# Patient Record
Sex: Female | Born: 1965 | Race: White | Hispanic: No | Smoking: Never smoker
Health system: Southern US, Community
[De-identification: ages and names within clinical notes are randomized; demographics above are authoritative.]

## PROBLEM LIST (undated history)

## (undated) HISTORY — PX: BREAST LUMPECTOMY: SHX2

---

## 1998-06-23 ENCOUNTER — Ambulatory Visit (HOSPITAL_COMMUNITY): Admission: RE | Admit: 1998-06-23 | Discharge: 1998-06-23 | Payer: Self-pay | Admitting: Obstetrics and Gynecology

## 1999-06-23 ENCOUNTER — Ambulatory Visit (HOSPITAL_COMMUNITY): Admission: RE | Admit: 1999-06-23 | Discharge: 1999-06-23 | Payer: Self-pay | Admitting: Obstetrics and Gynecology

## 1999-06-23 ENCOUNTER — Encounter: Payer: Self-pay | Admitting: Obstetrics and Gynecology

## 1999-07-16 ENCOUNTER — Emergency Department (HOSPITAL_COMMUNITY): Admission: EM | Admit: 1999-07-16 | Discharge: 1999-07-16 | Payer: Self-pay | Admitting: Emergency Medicine

## 1999-08-28 ENCOUNTER — Ambulatory Visit (HOSPITAL_COMMUNITY): Admission: RE | Admit: 1999-08-28 | Discharge: 1999-08-28 | Payer: Self-pay | Admitting: Obstetrics and Gynecology

## 1999-11-15 ENCOUNTER — Inpatient Hospital Stay (HOSPITAL_COMMUNITY): Admission: AD | Admit: 1999-11-15 | Discharge: 1999-11-17 | Payer: Self-pay | Admitting: Obstetrics and Gynecology

## 1999-12-26 ENCOUNTER — Other Ambulatory Visit: Admission: RE | Admit: 1999-12-26 | Discharge: 1999-12-26 | Payer: Self-pay | Admitting: Obstetrics and Gynecology

## 2001-01-08 ENCOUNTER — Other Ambulatory Visit: Admission: RE | Admit: 2001-01-08 | Discharge: 2001-01-08 | Payer: Self-pay | Admitting: Obstetrics and Gynecology

## 2002-09-04 ENCOUNTER — Encounter: Admission: RE | Admit: 2002-09-04 | Discharge: 2002-09-04 | Payer: Self-pay | Admitting: *Deleted

## 2002-09-04 ENCOUNTER — Encounter: Payer: Self-pay | Admitting: *Deleted

## 2002-09-14 ENCOUNTER — Ambulatory Visit (HOSPITAL_COMMUNITY): Admission: RE | Admit: 2002-09-14 | Discharge: 2002-09-14 | Payer: Self-pay | Admitting: *Deleted

## 2002-10-07 ENCOUNTER — Observation Stay (HOSPITAL_COMMUNITY): Admission: RE | Admit: 2002-10-07 | Discharge: 2002-10-08 | Payer: Self-pay | Admitting: Surgery

## 2002-10-07 ENCOUNTER — Encounter: Payer: Self-pay | Admitting: Surgery

## 2004-06-18 HISTORY — PX: CHOLECYSTECTOMY: SHX55

## 2004-08-24 ENCOUNTER — Ambulatory Visit: Payer: Self-pay | Admitting: Family Medicine

## 2007-10-15 ENCOUNTER — Ambulatory Visit: Payer: Self-pay | Admitting: Family Medicine

## 2009-07-26 ENCOUNTER — Ambulatory Visit: Payer: Self-pay

## 2009-11-21 ENCOUNTER — Ambulatory Visit: Payer: Self-pay | Admitting: Family Medicine

## 2010-05-03 ENCOUNTER — Observation Stay: Payer: Self-pay | Admitting: Internal Medicine

## 2011-08-30 ENCOUNTER — Ambulatory Visit: Payer: Self-pay | Admitting: Family Medicine

## 2013-02-11 LAB — CBC AND DIFFERENTIAL
HEMATOCRIT: 43 % (ref 36–46)
HEMOGLOBIN: 13.6 g/dL (ref 12.0–16.0)
NEUTROS ABS: 67 /uL
Platelets: 266 10*3/uL (ref 150–399)
WBC: 7.5 10^3/mL

## 2013-04-14 ENCOUNTER — Ambulatory Visit: Payer: Self-pay | Admitting: Family Medicine

## 2013-04-14 LAB — HM MAMMOGRAPHY

## 2014-11-01 LAB — HEPATIC FUNCTION PANEL
ALT: 22 U/L (ref 7–35)
AST: 21 U/L (ref 13–35)
Alkaline Phosphatase: 94 U/L (ref 25–125)
BILIRUBIN, TOTAL: 0.4 mg/dL

## 2014-11-01 LAB — BASIC METABOLIC PANEL
BUN: 9 mg/dL (ref 4–21)
Creatinine: 0.7 mg/dL (ref 0.5–1.1)
GLUCOSE: 107 mg/dL
Potassium: 3.9 mmol/L (ref 3.4–5.3)
SODIUM: 134 mmol/L — AB (ref 137–147)

## 2014-11-01 LAB — TSH: TSH: 1.47 u[IU]/mL (ref 0.41–5.90)

## 2015-01-03 ENCOUNTER — Other Ambulatory Visit: Payer: Self-pay | Admitting: Family Medicine

## 2015-01-05 NOTE — Telephone Encounter (Signed)
Do you want both of these refilled Sheryl Tran 

## 2015-01-07 ENCOUNTER — Other Ambulatory Visit: Payer: Self-pay

## 2015-01-07 NOTE — Telephone Encounter (Signed)
Fax from [facility] please review-last OV was in May 2016-order is in the chart

## 2015-01-09 MED ORDER — BUTALBITAL-ASA-CAFFEINE 50-325-40 MG PO CAPS: ORAL_CAPSULE | ORAL | Status: DC

## 2015-01-09 MED ORDER — ALPRAZOLAM 0.5 MG PO TBDP: 0.5000 mg | ORAL_TABLET | Freq: Two times a day (BID) | ORAL | Status: DC | PRN

## 2015-01-10 ENCOUNTER — Telehealth: Payer: Self-pay | Admitting: Family Medicine

## 2015-01-10 NOTE — Telephone Encounter (Signed)
I rec'd a voicemail message from [facility] stating they rec'd several duplicate Rx faxed today and they are need to clarify which Rx is correct.  CB#[phone]/MW

## 2015-02-15 ENCOUNTER — Encounter: Payer: Self-pay | Admitting: Podiatry

## 2015-02-15 ENCOUNTER — Ambulatory Visit (INDEPENDENT_AMBULATORY_CARE_PROVIDER_SITE_OTHER): Payer: BLUE CROSS/BLUE SHIELD | Admitting: Podiatry

## 2015-02-15 ENCOUNTER — Ambulatory Visit (INDEPENDENT_AMBULATORY_CARE_PROVIDER_SITE_OTHER): Payer: BLUE CROSS/BLUE SHIELD

## 2015-02-15 VITALS — BP 119/82 | HR 91 | Resp 17

## 2015-02-15 DIAGNOSIS — M779 Enthesopathy, unspecified: Secondary | ICD-10-CM

## 2015-02-15 DIAGNOSIS — M79671 Pain in right foot: Secondary | ICD-10-CM

## 2015-02-15 MED ORDER — MELOXICAM 7.5 MG PO TABS: 7.5000 mg | ORAL_TABLET | Freq: Every day | ORAL | Status: DC

## 2015-02-15 NOTE — Progress Notes (Signed)
Patient ID: Sheryl Tran, female   DOB: [birth year], 49 y.o.   MRN: [id number]  Subjective: 49 year old female presents the office today for concerns her right foot pain. She states that before weeks ago she got out of bed her foot was numb resulting in her twisting her foot. She states that she was having pain in the outside portion of her foot which continues without much relief. She was trying ice the area which helps and she also injured a walk-in clinic. She states that flip-flops seem to help in the tennis shoe seems to hurt the area. She denies any numbness or tingling in his time. She says the area swells intermittently denies any redness to the area. She states that the spinous mostly outside portion of her foot. She states that she has pain with prolonged weightbearing or ambulation. No other complaints at this time in no acute changes.  Objective: AAO 3, NAD DP/PT pulses palpable, CRT less than 3 seconds Protective sensation intact with Simms Weinstein monofilament, Achilles tendon reflex intact. There is tenderness palpation along the course of the peroneal tendons just inferior to the lateral malleolus and just proximal to the fifth metatarsal base. There is pain with eversion of the foot. Peroneal tendons appear to be intact. There is no other areas tenderness of pinpoint bony tenderness to bilateral lower shoes. There is mild edema along the lateral aspect of the foot. There is no pain along the course the lateral ankle ligaments. There is no tenderness of the ankle joint and and ankle, subtalar joint range of motion is intact.There is no associated erythema or increase in warmth. No open lesions or pre-ulcerative lesions. There is no pain with calf compression, swelling, warmth, erythema. MMT 5/5, ROM WNL  Assessment: 49 year old female with peroneal tendinitis  Plan: -Treatment options discussed including all alternatives, risks, and complications -X-rays were obtained and reviewed  with the patient.  -discussed likely etiology of her symptoms. -I discussed a steroid injection due to the prolonged nature of the swelling and the pain. I discussed the risks and complications including tendon rupture for which she understands and verbally consents. Under sterile conditions a total of 1 mL mixture of dexamethasone phosphate and 2% lidocaine plain was infiltrated into the area of maximal tenderness around the pain nail tendon. Care was taken not to infiltrate directly into the tendon. She tolerated the injection well any complications. Post injection care was discussed. -Ankle brace was dispensed. -Ice and elevation. -Prescribed mobic. Discussed side effects of the medication and directed to stop if any are to occur and call the office.  -Follow-up 2-3 weeks or sooner if any problems arise. In the meantime, encouraged to call the office with any questions, concerns, change in symptoms. Likely give range of motion/rehabilitation exercises for peroneal tendons appointment.  Ovid Curd, DPM

## 2015-02-23 DIAGNOSIS — I10 Essential (primary) hypertension: Secondary | ICD-10-CM | POA: Insufficient documentation

## 2015-02-23 DIAGNOSIS — G43909 Migraine, unspecified, not intractable, without status migrainosus: Secondary | ICD-10-CM | POA: Insufficient documentation

## 2015-02-23 DIAGNOSIS — G4700 Insomnia, unspecified: Secondary | ICD-10-CM | POA: Insufficient documentation

## 2015-02-23 DIAGNOSIS — N943 Premenstrual tension syndrome: Secondary | ICD-10-CM | POA: Insufficient documentation

## 2015-02-23 DIAGNOSIS — F329 Major depressive disorder, single episode, unspecified: Secondary | ICD-10-CM | POA: Insufficient documentation

## 2015-02-23 DIAGNOSIS — E669 Obesity, unspecified: Secondary | ICD-10-CM | POA: Insufficient documentation

## 2015-02-23 DIAGNOSIS — E039 Hypothyroidism, unspecified: Secondary | ICD-10-CM | POA: Insufficient documentation

## 2015-02-23 DIAGNOSIS — F32A Depression, unspecified: Secondary | ICD-10-CM | POA: Insufficient documentation

## 2015-02-23 DIAGNOSIS — F419 Anxiety disorder, unspecified: Secondary | ICD-10-CM | POA: Insufficient documentation

## 2015-02-23 DIAGNOSIS — M792 Neuralgia and neuritis, unspecified: Secondary | ICD-10-CM | POA: Insufficient documentation

## 2015-02-24 ENCOUNTER — Encounter: Payer: Self-pay | Admitting: Family Medicine

## 2015-02-24 ENCOUNTER — Ambulatory Visit (INDEPENDENT_AMBULATORY_CARE_PROVIDER_SITE_OTHER): Payer: BLUE CROSS/BLUE SHIELD | Admitting: Family Medicine

## 2015-02-24 ENCOUNTER — Ambulatory Visit: Payer: Self-pay | Admitting: Family Medicine

## 2015-02-24 VITALS — BP 118/78 | HR 60 | Temp 98.7°F | Resp 16 | Wt 173.0 lb

## 2015-02-24 DIAGNOSIS — E039 Hypothyroidism, unspecified: Secondary | ICD-10-CM | POA: Diagnosis not present

## 2015-02-24 DIAGNOSIS — N943 Premenstrual tension syndrome: Secondary | ICD-10-CM

## 2015-02-24 DIAGNOSIS — G4700 Insomnia, unspecified: Secondary | ICD-10-CM

## 2015-02-24 DIAGNOSIS — F419 Anxiety disorder, unspecified: Secondary | ICD-10-CM

## 2015-02-24 DIAGNOSIS — E669 Obesity, unspecified: Secondary | ICD-10-CM | POA: Diagnosis not present

## 2015-02-24 MED ORDER — PHENTERMINE HCL 37.5 MG PO CAPS: 37.5000 mg | ORAL_CAPSULE | ORAL | Status: DC

## 2015-02-24 NOTE — Progress Notes (Signed)
Patient ID: Sheryl Tran, female   DOB: 1965-09-01, 49 y.o.   MRN: [id number]       Patient: Sheryl Tran Female    DOB: 06/10/1966   49 y.o.   MRN: [id number] Visit Date: 02/24/2015  Today's Provider: [name], MD   Chief Complaint  Patient presents with  . Hypothyroidism    follow-up, last ov was on 11/01/2014  . Anxiety    follow-up  . Obesity    follow-up  . Insomnia    follow-up, started on trazodone  . right ankle pain    started started 7 weeks ago. have a brace on which is helping" pt stated   Subjective:    Anxiety Presents for follow-up visit. The problem has been resolved. Symptoms include insomnia. Patient reports no suicidal ideas.    Insomnia Primary symptoms: no fragmented sleep, no sleep disturbance, no somnolence, no frequent awakening, no premature morning awakening, no malaise/fatigue.  The current episode started more than one month. The problem occurs intermittently. Past treatments include meditation (trazodone).       Allergies  Allergen Reactions  . Amoxicillin Rash   Previous Medications   ALPRAZOLAM (NIRAVAM) 0.5 MG DISSOLVABLE TABLET    Take 1 tablet (0.5 mg total) by mouth 2 (two) times daily as needed for anxiety.   ALPRAZOLAM (XANAX) 0.5 MG TABLET    TAKE 1 TABLET BY MOUTH TWICE A DAY AS NEEDED   AMLODIPINE (NORVASC) 5 MG TABLET    Take 1 tablet by mouth daily.   BUPROPION (WELLBUTRIN XL) 150 MG 24 HR TABLET    Take 1 tablet by mouth daily.   BUTALBITAL-ASPIRIN-CAFFEINE (FIORINAL) 50-325-40 MG PER CAPSULE    TAKE 1 TO 2 CAPSULES BY MOUTH EVERY 8 HOURS AS NEEDED   CALCIUM CARBONATE-VITAMIN D 600-200 MG-UNIT CAPS    Take 1 tablet by mouth 2 (two) times daily.   FLUTICASONE (FLONASE) 50 MCG/ACT NASAL SPRAY    Place 2 sprays into the nose daily.   FUROSEMIDE (LASIX) 20 MG TABLET    1 tablet daily as needed.   HYDROCODONE-ACETAMINOPHEN (NORCO/VICODIN) 5-325 MG PER TABLET    Take 1 tablet by mouth every 6 (six) hours as needed.   LEVOTHYROXINE (SYNTHROID, LEVOTHROID) 137 MCG TABLET    Take 1 tablet by mouth daily.   MELOXICAM (MOBIC) 7.5 MG TABLET    Take 1 tablet (7.5 mg total) by mouth daily.   MULTIPLE VITAMINS-MINERALS PO    Take 1 tablet by mouth daily.   TRAZODONE (DESYREL) 50 MG TABLET    Take 1 tablet by mouth at bedtime. 1 hour prior to bed time    Review of Systems  Constitutional: Positive for fatigue. Negative for malaise/fatigue.  HENT: Positive for congestion.        Feel not having a lot of energy. Feels like a dry congestion" pt stated also reports sinus headaches.  Respiratory: Negative.   Cardiovascular: Negative.   Gastrointestinal: Negative.   Musculoskeletal:       C/o right ankle pain, when getting out of bed it rolled, have a brace on which is helping, pt reports not having pain at this moment.  Allergic/Immunologic: Negative.   Neurological: Negative.   Hematological: Negative.   Psychiatric/Behavioral: Negative for suicidal ideas and sleep disturbance. The patient has insomnia.    pt reports that she also want to talk about her weight.   Social History  Substance Use Topics  . Smoking status: Former Games developer  . Smokeless tobacco: Never  Used  . Alcohol Use: 0.0 oz/week    0 Standard drinks or equivalent per week     Comment: OCCASIONALLY   Objective:   BP 118/78 mmHg  Pulse 60  Temp(Src) 98.7 F (37.1 C) (Oral)  Resp 16  Wt 173 lb (78.472 kg)  LMP 02/17/2015  Physical Exam  Constitutional: She is oriented to person, place, and time. She appears well-developed and well-nourished.  Mildly obese white female.  HENT:  Head: Normocephalic and atraumatic.  Right Ear: External ear normal.  Left Ear: External ear normal.  Nose: Nose normal.  Neck: Neck supple.  Cardiovascular: Normal rate, regular rhythm and normal heart sounds.   Pulmonary/Chest: Effort normal and breath sounds normal.  Abdominal: Soft.  Neurological: She is alert and oriented to person, place, and time.    Skin: Skin is warm and dry.  Psychiatric: She has a normal mood and affect. Her behavior is normal. Judgment and thought content normal.        Assessment & Plan:     1. Hypothyroidism, unspecified hypothyroidism type TSH  2. Acute anxiety Chronic issue.  3. Obesity Short term answer--meds for 3-5 months if effective. - phentermine 37.5 MG capsule; Take 1 capsule (37.5 mg total) by mouth every morning.  Dispense: 30 capsule; Refill: 3  4. Insomnia   5. PMS (premenstrual syndrome)        [name], MD  [facility]

## 2015-03-08 ENCOUNTER — Ambulatory Visit (INDEPENDENT_AMBULATORY_CARE_PROVIDER_SITE_OTHER): Payer: BLUE CROSS/BLUE SHIELD | Admitting: Podiatry

## 2015-03-08 ENCOUNTER — Encounter: Payer: Self-pay | Admitting: Podiatry

## 2015-03-08 VITALS — BP 107/73 | HR 87 | Resp 18

## 2015-03-08 DIAGNOSIS — M79671 Pain in right foot: Secondary | ICD-10-CM

## 2015-03-08 DIAGNOSIS — M779 Enthesopathy, unspecified: Secondary | ICD-10-CM | POA: Diagnosis not present

## 2015-03-08 NOTE — Patient Instructions (Signed)
Peroneal Tendinitis with Rehab Tendonitis is inflammation of a tendon. Inflammation of the tendons on the back of the outer ankle (peroneal tendons) is known as peroneal tendonitis. The peroneal tendons are responsible for connecting the muscles that allow you to stand on your tiptoes to the bones of the ankle. For this reason, peroneal tendonitis often causes pain when trying to complete such motions. Peroneal tendonitis often involves a tear (strain) of the peroneal tendons. Strains are classified into three categories. Grade 1 strains cause pain, but the tendon is not lengthened. Grade 2 strains include a lengthened ligament, due to the ligament being stretched or partially ruptured. With grade 2 strains there is still function, although function may be decreased. Grade 3 strains involve a complete tear of the tendon or muscle, and function is usually impaired. SYMPTOMS   Pain, tenderness, swelling, warmth, or redness over the back of the outer side of the ankle, the outer part of the mid-foot, or the bottom of the arch.  Pain that gets worse with ankle motion (especially when pushing off or pushing down with the front of the foot), or when standing on the ball of the foot or pushing the foot outward.  Crackling sound (crepitation) when the tendon is moved or touched. CAUSES  Peroneal tendinitis occurs when injury to the peroneal tendons causes the body to respond with inflammation. Common causes of injury include:  An overuse injury, in which the groove behind the outer ankle (where the tendon is located) causes wear on the tendon.  A sudden stress placed on the tendon, such as from an increase in the intensity, frequency, or duration of training.  Direct hit (trauma) to the tendon.  Return to activity too soon after a previous ankle injury. RISK INCREASES WITH:  Sports that require sudden, repetitive pushing off of the foot, such as jumping or quick starts.  Kicking and running sports,  especially running down hills or long distances.  Poor strength and flexibility.  Previous injury to the foot, ankle, or leg. PREVENTION  Warm up and stretch properly before activity.  Allow for adequate recovery between workouts.  Maintain physical fitness:  Strength, flexibility, and endurance.  Cardiovascular fitness.  Complete rehabilitation after previous injury. PROGNOSIS  If treated properly, peroneal tendonitis usually heals within 6 weeks.  RELATED COMPLICATIONS  Longer healing time, if not properly treated or if not given enough time to heal.  Recurring symptoms if activity is resumed too soon, with overuse, or when using poor technique.  If untreated, tendinitis may result in tendon rupture, requiring surgery. TREATMENT  Treatment first involves the use of ice and medicine to reduce pain and inflammation. The use of strengthening and stretching exercises may help reduce pain with activity. These exercises may be performed at home or with a therapist. Sometimes, the foot and ankle will be restrained for 10 to 14 days to promote healing. Your caregiver may advise that you place a heel lift in your shoes to reduce the stress placed on the tendon. If nonsurgical treatment is unsuccessful, surgery to remove the inflamed tendon lining (sheath) may be advised.  MEDICATION   If pain medicine is needed, nonsteroidal anti-inflammatory medicines (aspirin and ibuprofen), or other minor pain relievers (acetaminophen), are often advised.  Do not take pain medicine for 7 days before surgery.  Prescription pain relievers may be given, if your caregiver thinks they are needed. Use only as directed and only as much as you need. HEAT AND COLD  Cold treatment (icing) should   be applied for 10 to 15 minutes every 2 to 3 hours for inflammation and pain, and immediately after activity that aggravates your symptoms. Use ice packs or an ice massage.  Heat treatment may be used before  performing stretching and strengthening activities prescribed by your caregiver, physical therapist, or athletic trainer. Use a heat pack or a warm water soak. SEEK MEDICAL CARE IF:  Symptoms get worse or do not improve in 2 to 4 weeks, despite treatment.  New, unexplained symptoms develop. (Drugs used in treatment may produce side effects.) EXERCISES RANGE OF MOTION (ROM) AND STRETCHING EXERCISES - Peroneal Tendinitis These exercises may help you when beginning to rehabilitate your injury. Your symptoms may resolve with or without further involvement from your physician, physical therapist or athletic trainer. While completing these exercises, remember:   Restoring tissue flexibility helps normal motion to return to the joints. This allows healthier, less painful movement and activity.  An effective stretch should be held for at least 30 seconds.  A stretch should never be painful. You should only feel a gentle lengthening or release in the stretched tissue. RANGE OF MOTION - Ankle Eversion  Sit with your right / left ankle crossed over your opposite knee.  Grip your foot with your opposite hand, placing your thumb on the top of your foot and your fingers across the bottom of your foot.  Gently push your foot downward with a slight rotation, so your littlest toes rise slightly toward the ceiling.  You should feel a gentle stretch on the inside of your ankle. Hold the stretch for __________ seconds. Repeat __________ times. Complete this exercise __________ times per day.  RANGE OF MOTION - Ankle Inversion  Sit with your right / left ankle crossed over your opposite knee.  Grip your foot with your opposite hand, placing your thumb on the bottom of your foot and your fingers across the top of your foot.  Gently pull your foot so the smallest toe comes toward you and your thumb pushes the inside of the ball of your foot away from you.  You should feel a gentle stretch on the outside of  your ankle. Hold the stretch for __________ seconds. Repeat __________ times. Complete this exercise __________ times per day.  RANGE OF MOTION - Ankle Plantar Flexion  Sit with your right / left leg crossed over your opposite knee.  Use your opposite hand to pull the top of your foot and toes toward you.  You should feel a gentle stretch on the top of your foot and ankle. Hold this position for __________ seconds. Repeat __________ times. Complete __________ times per day.  STRETCH - Gastroc, Standing  Place your hands on a wall.  Extend your right / left leg behind you, keeping the front knee somewhat bent.  Slightly point your toes inward on your back foot.  Keeping your right / left heel on the floor and your knee straight, shift your weight toward the wall, not allowing your back to arch.  You should feel a gentle stretch in the calf. Hold this position for __________ seconds. Repeat __________ times. Complete this stretch __________ times per day. STRETCH - Soleus, Standing  Place your hands on a wall.  Extend your right / left leg behind you, keeping the other knee somewhat bent.  Slightly point your toes inward on your back foot.  Keep your heel on the floor, bend your back knee, and slightly shift your weight over the back leg so that   you feel a gentle stretch deep in your back calf.  Hold this position for __________ seconds. Repeat __________ times. Complete this stretch __________ times per day. STRETCH - Gastrocsoleus, Standing Note: This exercise can place a lot of stress on your foot and ankle. Please complete this exercise only if specifically instructed by your caregiver.   Place the ball of your right / left foot on a step, keeping your other foot firmly on the same step.  Hold on to the wall or a rail for balance.  Slowly lift your other foot, allowing your body weight to press your heel down over the edge of the step.  You should feel a stretch in your  right / left calf.  Hold this position for __________ seconds.  Repeat this exercise with a slight bend in your knee. Repeat __________ times. Complete this stretch __________ times per day.  STRENGTHENING EXERCISES - Peroneal Tendinitis  These exercises may help you when beginning to rehabilitate your injury. They may resolve your symptoms with or without further involvement from your physician, physical therapist or athletic trainer. While completing these exercises, remember:   Muscles can gain both the endurance and the strength needed for everyday activities through controlled exercises.  Complete these exercises as instructed by your physician, physical therapist or athletic trainer. Increase the resistance and repetitions only as guided by your caregiver. STRENGTH - Dorsiflexors  Secure a rubber exercise band or tubing to a fixed object (table, pole) and loop the other end around your right / left foot.  Sit on the floor facing the fixed object. The band should be slightly tense when your foot is relaxed.  Slowly draw your foot back toward you, using your ankle and toes.  Hold this position for __________ seconds. Slowly release the tension in the band and return your foot to the starting position. Repeat __________ times. Complete this exercise __________ times per day.  STRENGTH - Towel Curls  Sit in a chair, on a non-carpeted surface.  Place your foot on a towel, keeping your heel on the floor.  Pull the towel toward your heel only by curling your toes. Keep your heel on the floor.  If instructed by your physician, physical therapist or athletic trainer, add weight to the end of the towel. Repeat __________ times. Complete this exercise __________ times per day. STRENGTH - Ankle Eversion   Secure one end of a rubber exercise band or tubing to a fixed object (table, pole). Loop the other end around your foot, just before your toes.  Place your fists between your knees.  This will focus your strengthening at your ankle.  Drawing the band across your opposite foot, away from the pole, slowly, pull your little toe out and up. Make sure the band is positioned to resist the entire motion.  Hold this position for __________ seconds.  Have your muscles resist the band, as it slowly pulls your foot back to the starting position. Repeat __________ times. Complete this exercise __________ times per day.  Document Released: 06/04/2005 Document Revised: 10/19/2013 Document Reviewed: 09/16/2008 Exit[employer] Patient Information 2015 [employer]. This information is not intended to replace advice given to you by your health care provider. Make sure you discuss any questions you have with your health care provider.  

## 2015-03-09 NOTE — Progress Notes (Signed)
Patient ID: CAREE Tran, female   DOB: April 04, 1966, 49 y.o.   MRN: [id number]  Subjective: 49 year old female presents the office today for follow-up evaluation of right foot pain, tendinitis. She states that since last appointment she's had considerable improvement in pain. She wears the ankle brace daily which seems to help. She had no complications with the injection. She is continuing ice. She's been continuing with the meloxicam without any side effects. No other complaints at this time in no acute changes.  Objective: AAO 3, NAD Neurovascular status intact and unchanged There is mild tenderness to palpation just proximal to the fifth metatarsal base on the right foot although appears to be greatly improved. There is no pain on the course of the peroneal tendons elsewhere and no defect is noted. No pain with eversion. There is trace edema over the lateral aspect of the foot over the area of tenderness. There is no associated erythema or increase in warmth. No other areas of tenderness to bilateral lower extremities. No open lesions or pre-ulcer lesions. There is no pain with calf compression, swelling, warmth, erythema.  Assessment: 49 year old female with resolving right foot tendinitis  Plan: -Treatment options discussed including all alternatives, risks, and complications -Rehabilitation exercises were discussed with patient. She concern to wean off the ankle brace as able. Anti-inflammatories as needed. Ice to the area. Continue supportive shoe gear. I discussed orthotics. Follow-up if symptoms are not completely resolved within 6 weeks or sooner if any problems are to arise. In the meantime I encouraged her to call the office with any questions, concerns, changes symptoms.  Ovid Curd, DPM

## 2015-03-10 ENCOUNTER — Other Ambulatory Visit: Payer: Self-pay | Admitting: Family Medicine

## 2015-03-12 ENCOUNTER — Ambulatory Visit (INDEPENDENT_AMBULATORY_CARE_PROVIDER_SITE_OTHER): Payer: BLUE CROSS/BLUE SHIELD

## 2015-03-12 ENCOUNTER — Ambulatory Visit: Payer: BLUE CROSS/BLUE SHIELD

## 2015-03-12 DIAGNOSIS — Z23 Encounter for immunization: Secondary | ICD-10-CM

## 2015-03-25 ENCOUNTER — Other Ambulatory Visit: Payer: Self-pay | Admitting: Family Medicine

## 2015-03-31 ENCOUNTER — Other Ambulatory Visit: Payer: Self-pay | Admitting: Family Medicine

## 2015-04-07 ENCOUNTER — Other Ambulatory Visit: Payer: Self-pay | Admitting: Family Medicine

## 2015-04-19 ENCOUNTER — Ambulatory Visit: Payer: BLUE CROSS/BLUE SHIELD | Admitting: Podiatry

## 2015-04-19 ENCOUNTER — Ambulatory Visit: Payer: BLUE CROSS/BLUE SHIELD | Admitting: Sports Medicine

## 2015-05-14 ENCOUNTER — Other Ambulatory Visit: Payer: Self-pay | Admitting: Family Medicine

## 2015-06-30 ENCOUNTER — Ambulatory Visit: Payer: BLUE CROSS/BLUE SHIELD | Admitting: Family Medicine

## 2015-08-01 ENCOUNTER — Other Ambulatory Visit: Payer: Self-pay | Admitting: Family Medicine

## 2015-08-02 ENCOUNTER — Other Ambulatory Visit: Payer: Self-pay

## 2015-08-02 MED ORDER — BUPROPION HCL ER (XL) 150 MG PO TB24: 150.0000 mg | ORAL_TABLET | Freq: Every day | ORAL | Status: DC

## 2015-08-15 ENCOUNTER — Other Ambulatory Visit: Payer: Self-pay | Admitting: Family Medicine

## 2015-08-18 ENCOUNTER — Other Ambulatory Visit: Payer: Self-pay | Admitting: Family Medicine

## 2015-08-23 ENCOUNTER — Other Ambulatory Visit: Payer: Self-pay | Admitting: Family Medicine

## 2015-08-23 NOTE — Telephone Encounter (Signed)
Needs appt for further.

## 2015-09-24 ENCOUNTER — Other Ambulatory Visit: Payer: Self-pay | Admitting: Family Medicine

## 2015-09-26 NOTE — Telephone Encounter (Signed)
D gilberts patient-aa

## 2015-09-26 NOTE — Telephone Encounter (Signed)
Printed, please fax or call in to pharmacy. Thank you.   

## 2015-10-02 ENCOUNTER — Other Ambulatory Visit: Payer: Self-pay | Admitting: Family Medicine

## 2015-10-04 NOTE — Telephone Encounter (Signed)
Needs appointment this summer

## 2015-11-02 ENCOUNTER — Other Ambulatory Visit: Payer: Self-pay | Admitting: Family Medicine

## 2015-11-02 DIAGNOSIS — G43809 Other migraine, not intractable, without status migrainosus: Secondary | ICD-10-CM

## 2015-11-02 NOTE — Telephone Encounter (Signed)
Printed, please fax or call in to pharmacy. Thank you.   

## 2015-11-02 NOTE — Telephone Encounter (Signed)
Dr. Sullivan LoneGilbert Pt.   Thanks,   -Vernona RiegerLaura

## 2015-11-10 ENCOUNTER — Other Ambulatory Visit: Payer: Self-pay | Admitting: Family Medicine

## 2015-11-10 DIAGNOSIS — F419 Anxiety disorder, unspecified: Secondary | ICD-10-CM

## 2015-11-10 NOTE — Telephone Encounter (Signed)
Called in Prescription for Alprazolam [facility] Mg to [facility] [address].  Thanks,  -Joseline

## 2015-12-15 ENCOUNTER — Other Ambulatory Visit: Payer: Self-pay | Admitting: Family Medicine

## 2015-12-15 DIAGNOSIS — G43809 Other migraine, not intractable, without status migrainosus: Secondary | ICD-10-CM

## 2016-01-02 ENCOUNTER — Other Ambulatory Visit: Payer: Self-pay

## 2016-01-02 MED ORDER — FUROSEMIDE 20 MG PO TABS: ORAL_TABLET | ORAL | Status: DC

## 2016-01-02 NOTE — Telephone Encounter (Signed)
[facility] for [facility]ill request for Furosemide-aa

## 2016-01-18 ENCOUNTER — Other Ambulatory Visit: Payer: Self-pay | Admitting: Physician Assistant

## 2016-01-18 DIAGNOSIS — F419 Anxiety disorder, unspecified: Secondary | ICD-10-CM

## 2016-02-23 ENCOUNTER — Other Ambulatory Visit: Payer: Self-pay | Admitting: Family Medicine

## 2016-02-23 ENCOUNTER — Other Ambulatory Visit: Payer: Self-pay | Admitting: Physician Assistant

## 2016-02-23 DIAGNOSIS — G43809 Other migraine, not intractable, without status migrainosus: Secondary | ICD-10-CM

## 2016-02-23 DIAGNOSIS — F419 Anxiety disorder, unspecified: Secondary | ICD-10-CM

## 2016-02-28 NOTE — Telephone Encounter (Signed)
Dr Elisabeth CaraGilbert's please review-aa

## 2016-02-28 NOTE — Telephone Encounter (Signed)
Lmtcb, called [facility] [address] they state last RX on file was from may with 1 refill. Need to find out if patient came and took RX from August with 4 refills or what happened-aa

## 2016-02-28 NOTE — Telephone Encounter (Signed)
See message.

## 2016-02-28 NOTE — Telephone Encounter (Signed)
Ana can we call pharmacy to check on this. I see Dr. Reece AgarG gave her Rx on 01/18/16 for 45 tab with 4 refills. If she is out of refills already she may be taking more than he wants her to.

## 2016-02-29 NOTE — Telephone Encounter (Signed)
Rx called in to pharmacy. 

## 2016-02-29 NOTE — Telephone Encounter (Signed)
Please call in Fioricet.  

## 2016-03-01 NOTE — Telephone Encounter (Signed)
lmtcb-aa 

## 2016-03-05 ENCOUNTER — Ambulatory Visit (INDEPENDENT_AMBULATORY_CARE_PROVIDER_SITE_OTHER): Payer: BLUE CROSS/BLUE SHIELD | Admitting: Family Medicine

## 2016-03-05 DIAGNOSIS — Z23 Encounter for immunization: Secondary | ICD-10-CM | POA: Diagnosis not present

## 2016-03-05 NOTE — Telephone Encounter (Signed)
Needs appointment

## 2016-03-10 ENCOUNTER — Other Ambulatory Visit: Payer: Self-pay | Admitting: Family Medicine

## 2016-04-10 ENCOUNTER — Other Ambulatory Visit: Payer: Self-pay | Admitting: Family Medicine

## 2016-04-10 DIAGNOSIS — G43809 Other migraine, not intractable, without status migrainosus: Secondary | ICD-10-CM

## 2016-04-10 DIAGNOSIS — F419 Anxiety disorder, unspecified: Secondary | ICD-10-CM

## 2016-04-11 NOTE — Telephone Encounter (Signed)
LOV 02/24/2015. Last refill Xanax was 03/05/2016, last refill fiorinal was 02/29/2016. Allene DillonEmily Drozdowski, CMA

## 2016-05-02 ENCOUNTER — Ambulatory Visit (INDEPENDENT_AMBULATORY_CARE_PROVIDER_SITE_OTHER): Payer: BLUE CROSS/BLUE SHIELD | Admitting: Family Medicine

## 2016-05-02 ENCOUNTER — Encounter: Payer: Self-pay | Admitting: Family Medicine

## 2016-05-02 VITALS — BP 128/84 | HR 62 | Temp 98.4°F | Resp 16 | Ht 62.5 in | Wt 172.2 lb

## 2016-05-02 DIAGNOSIS — F419 Anxiety disorder, unspecified: Secondary | ICD-10-CM | POA: Diagnosis not present

## 2016-05-02 DIAGNOSIS — I10 Essential (primary) hypertension: Secondary | ICD-10-CM

## 2016-05-02 DIAGNOSIS — G43809 Other migraine, not intractable, without status migrainosus: Secondary | ICD-10-CM

## 2016-05-02 DIAGNOSIS — E039 Hypothyroidism, unspecified: Secondary | ICD-10-CM | POA: Diagnosis not present

## 2016-05-02 MED ORDER — ALPRAZOLAM 0.5 MG PO TABS: 0.5000 mg | ORAL_TABLET | Freq: Two times a day (BID) | ORAL | 5 refills | Status: DC | PRN

## 2016-05-02 MED ORDER — BUTALBITAL-ASPIRIN-CAFFEINE 50-325-40 MG PO CAPS: ORAL_CAPSULE | ORAL | 5 refills | Status: DC

## 2016-05-02 NOTE — Progress Notes (Signed)
Patient: Sheryl Tran, Female    DOB: 1966-01-24, 50 y.o.   MRN: [id number] Visit Date: 05/02/2016  Today's Provider: Megan Mansichard Gilbert Jr, MD   Chief Complaint  Patient presents with  . Annual Exam   Subjective:    Annual physical exam Sheryl Tran is a 50 y.o. female who presents today for health maintenance and complete physical. She feels well. She reports exercising . She reports she is sleeping well except during menstrual cycle. Overall she feels ok. She wishes to lose some weight but has no dietary plans. ----------------------------------------------------------------- [facility]  Review of Systems  Constitutional: Negative.   HENT: Negative.   Eyes: Negative.   Respiratory: Negative.   Cardiovascular: Negative.   Gastrointestinal: Negative.   Endocrine: Negative.   Genitourinary: Positive for vaginal bleeding (increased).  Musculoskeletal: Negative.   Skin: Negative.   Allergic/Immunologic: Negative.   Neurological: Negative.   Hematological: Negative.   Psychiatric/Behavioral: Sleep disturbance: during menstrual cycle. The patient is nervous/anxious (during menstrual cycle).     Social History      She  reports that she has quit smoking. She has never used smokeless tobacco. She reports that she drinks alcohol. She reports that she does not use drugs.       Social History   Social History  . Marital status: Divorced    Spouse name: N/A  . Number of children: N/A  . Years of education: N/A   Social History Main Topics  . Smoking status: Former Games developermoker  . Smokeless tobacco: Never Used  . Alcohol use 0.0 oz/week     Comment: OCCASIONALLY  . Drug use: No  . Sexual activity: Not Asked   Other Topics Concern  . None   Social History Narrative  . None    No past medical history on file.   Patient Active Problem List   Diagnosis Date Noted  . Anxiety 02/23/2015  . Clinical  depression 02/23/2015  . BP (high blood pressure) 02/23/2015  . Adult hypothyroidism 02/23/2015  . Cannot sleep 02/23/2015  . Headache, migraine 02/23/2015  . Interdigital neuralgia 02/23/2015  . Adiposity 02/23/2015  . Premenstrual tension syndrome 02/23/2015  . Menstrual molimen 02/23/2015    Past Surgical History:  Procedure Laterality Date  . CHOLECYSTECTOMY  2006    Family History        No family status information on file.        Her family history includes ADD / ADHD in her sister; Allergies in her mother; CVA in her paternal grandmother; Cerebrovascular Disease in her father; Coronary artery disease in her father; Hypertension in her father and mother; Hypothyroidism in her mother; Lung cancer in her maternal grandmother; Prostate cancer in her maternal grandfather.     Allergies  Allergen Reactions  . Amoxicillin Rash     Current Outpatient Prescriptions:  .  ALPRAZolam (XANAX) 0.5 MG tablet, TAKE 1 TABLET BY MOUTH TWICE A DAY AS NEEDED, Disp: 45 tablet, Rfl: 0 .  amLODipine (NORVASC) 5 MG tablet, TAKE 1 TABLET BY MOUTH DAILY, Disp: 30 tablet, Rfl: 12 .  buPROPion (WELLBUTRIN XL) 150 MG 24 hr tablet, Take 1 tablet (150 mg total) by mouth daily., Disp: 90 tablet, Rfl: 3 .  butalbital-aspirin-caffeine (FIORINAL) 50-325-40 MG capsule, TAKE 1 TO 2 CAPSULES BY MOUTH EVERY 8 HOURS AS NEEDED, Disp: 45 capsule, Rfl: 2 .  Calcium Carbonate-Vitamin D 600-200 MG-UNIT CAPS, Take 1 tablet by mouth  2 (two) times daily., Disp: , Rfl:  .  fluticasone (FLONASE) 50 MCG/ACT nasal spray, USE 2 SPRAYS IN EACH NOSTRIL DAILY (Patient taking differently: USE 2 SPRAYS IN EACH NOSTRIL PRN), Disp: 16 g, Rfl: 12 .  furosemide (LASIX) 20 MG tablet, TAKE 1 TABLET EVERY DAY AS NEEDED, Disp: 30 tablet, Rfl: 2 .  levothyroxine (SYNTHROID, LEVOTHROID) 137 MCG tablet, TAKE 1 TABLET BY MOUTH EVERY DAY, Disp: 30 tablet, Rfl: 1 .  MULTIPLE VITAMINS-MINERALS PO, Take 1 tablet by mouth daily., Disp: , Rfl:      Patient Care Team: Maple Hudsonichard L Gilbert Jr., MD as PCP - General (Family Medicine)      Objective:   Vitals: BP 128/84 (BP Location: Right Arm, Patient Position: Sitting, Cuff Size: Normal)   Pulse 62   Temp 98.4 F (36.9 C) (Oral)   Resp 16   Ht 5' 2.5" (1.588 m)   Wt 172 lb 3.2 oz (78.1 kg)   BMI 30.99 kg/m    Physical Exam  Constitutional: She is oriented to person, place, and time. She appears well-developed and well-nourished.  HENT:  Head: Normocephalic and atraumatic.  Right Ear: External ear normal.  Left Ear: External ear normal.  Mouth/Throat: Oropharynx is clear and moist.  Eyes: Conjunctivae and EOM are normal. Pupils are equal, round, and reactive to light.  Neck: Normal range of motion. Neck supple.  Cardiovascular: Normal rate, regular rhythm, normal heart sounds and intact distal pulses.   Pulmonary/Chest: Effort normal and breath sounds normal.  Abdominal: Soft. Bowel sounds are normal.  Genitourinary:  Genitourinary Comments: Per Gynecologist.  Musculoskeletal: Normal range of motion.  Neurological: She is alert and oriented to person, place, and time. She has normal reflexes.  Skin: Skin is warm and dry.  Psychiatric: She has a normal mood and affect. Her behavior is normal. Judgment and thought content normal.     Depression Screen No flowsheet data found.    Assessment & Plan:     Routine Health Maintenance and Physical Exam  Exercise Activities and Dietary recommendations Goals    None      Immunization History  Administered Date(s) Administered  . Influenza,inj,Quad PF,36+ Mos 03/12/2015, 03/05/2016  . Tdap 10/07/2008    Health Maintenance  Topic Date Due  . HIV Screening  10/26/1981  . PAP SMEAR  10/27/1987  . TETANUS/TDAP  10/08/2018  . INFLUENZA VACCINE  Completed    Breast/pelvic/DRE per Gynecology. Discussed health benefits of physical activity, and encouraged her to engage in regular exercise appropriate for her age and  condition.  Obesity--mild Dietary changes discussed at length--especially portion sizes. Hypothyroid H/o Migraine Headaches Chronic Anxiety/Insomnia   --------------------------------------------------------------------   I have done the exam and reviewed the above chart and it is accurate to the best of my knowledge. DentistDragon  technology has been used in this note in any air is in the dictation or transcription are unintentional.  Megan Mansichard Gilbert Jr, MD  [facility]

## 2016-05-15 ENCOUNTER — Other Ambulatory Visit: Payer: Self-pay | Admitting: Family Medicine

## 2016-05-17 DIAGNOSIS — I10 Essential (primary) hypertension: Secondary | ICD-10-CM | POA: Diagnosis not present

## 2016-05-17 DIAGNOSIS — E039 Hypothyroidism, unspecified: Secondary | ICD-10-CM | POA: Diagnosis not present

## 2016-05-18 ENCOUNTER — Telehealth: Payer: Self-pay

## 2016-05-18 LAB — CBC WITH DIFFERENTIAL
Basophils Absolute: 0 10*3/uL (ref 0.0–0.2)
Basos: 1 %
EOS (ABSOLUTE): 0.1 10*3/uL (ref 0.0–0.4)
EOS: 1 %
HEMATOCRIT: 35.5 % (ref 34.0–46.6)
Hemoglobin: 11.3 g/dL (ref 11.1–15.9)
IMMATURE GRANULOCYTES: 0 %
Immature Grans (Abs): 0 10*3/uL (ref 0.0–0.1)
LYMPHS ABS: 1.8 10*3/uL (ref 0.7–3.1)
Lymphs: 27 %
MCH: 24.2 pg — ABNORMAL LOW (ref 26.6–33.0)
MCHC: 31.8 g/dL (ref 31.5–35.7)
MCV: 76 fL — ABNORMAL LOW (ref 79–97)
MONOS ABS: 0.5 10*3/uL (ref 0.1–0.9)
Monocytes: 8 %
Neutrophils Absolute: 4.2 10*3/uL (ref 1.4–7.0)
Neutrophils: 63 %
RBC: 4.66 x10E6/uL (ref 3.77–5.28)
RDW: 16.4 % — AB (ref 12.3–15.4)
WBC: 6.6 10*3/uL (ref 3.4–10.8)

## 2016-05-18 LAB — LIPID PANEL
CHOLESTEROL TOTAL: 213 mg/dL — AB (ref 100–199)
Chol/HDL Ratio: 2.7 ratio units (ref 0.0–4.4)
HDL: 78 mg/dL (ref >39)
LDL Calculated: 116 mg/dL — ABNORMAL HIGH (ref 0–99)
TRIGLYCERIDES: 97 mg/dL (ref 0–149)
VLDL Cholesterol Cal: 19 mg/dL (ref 5–40)

## 2016-05-18 LAB — COMPREHENSIVE METABOLIC PANEL
A/G RATIO: 1.7 (ref 1.2–2.2)
ALK PHOS: 84 IU/L (ref 39–117)
ALT: 24 IU/L (ref 0–32)
AST: 23 IU/L (ref 0–40)
Albumin: 4.5 g/dL (ref 3.5–5.5)
BUN/Creatinine Ratio: 13 (ref 9–23)
BUN: 9 mg/dL (ref 6–24)
Bilirubin Total: <0.2 mg/dL (ref 0.0–1.2)
CO2: 25 mmol/L (ref 18–29)
Calcium: 9.2 mg/dL (ref 8.7–10.2)
Chloride: 98 mmol/L (ref 96–106)
Creatinine, Ser: 0.67 mg/dL (ref 0.57–1.00)
GFR calc Af Amer: 119 mL/min/{1.73_m2} (ref >59)
GFR calc non Af Amer: 104 mL/min/{1.73_m2} (ref >59)
GLOBULIN, TOTAL: 2.7 g/dL (ref 1.5–4.5)
Glucose: 111 mg/dL — ABNORMAL HIGH (ref 65–99)
POTASSIUM: 4.3 mmol/L (ref 3.5–5.2)
SODIUM: 139 mmol/L (ref 134–144)
Total Protein: 7.2 g/dL (ref 6.0–8.5)

## 2016-05-18 LAB — TSH: TSH: 2.95 u[IU]/mL (ref 0.450–4.500)

## 2016-05-18 NOTE — Telephone Encounter (Signed)
lmtcb-aa 

## 2016-05-18 NOTE — Telephone Encounter (Signed)
-----   Message from Maple Hudsonichard L Gilbert Jr., MD sent at 05/18/2016  8:40 AM EST ----- Labs okay. Prediabetic. Continue to work on diet and exercise.

## 2016-05-18 NOTE — Telephone Encounter (Signed)
Pt advised-aa 

## 2016-05-23 ENCOUNTER — Other Ambulatory Visit: Payer: Self-pay | Admitting: Family Medicine

## 2016-05-23 DIAGNOSIS — Z01419 Encounter for gynecological examination (general) (routine) without abnormal findings: Secondary | ICD-10-CM | POA: Diagnosis not present

## 2016-05-23 DIAGNOSIS — N939 Abnormal uterine and vaginal bleeding, unspecified: Secondary | ICD-10-CM | POA: Diagnosis not present

## 2016-05-23 DIAGNOSIS — Z124 Encounter for screening for malignant neoplasm of cervix: Secondary | ICD-10-CM | POA: Diagnosis not present

## 2016-05-23 DIAGNOSIS — Z1389 Encounter for screening for other disorder: Secondary | ICD-10-CM | POA: Diagnosis not present

## 2016-05-23 DIAGNOSIS — D649 Anemia, unspecified: Secondary | ICD-10-CM | POA: Diagnosis not present

## 2016-05-23 DIAGNOSIS — N858 Other specified noninflammatory disorders of uterus: Secondary | ICD-10-CM | POA: Diagnosis not present

## 2016-05-23 DIAGNOSIS — Z1231 Encounter for screening mammogram for malignant neoplasm of breast: Secondary | ICD-10-CM | POA: Diagnosis not present

## 2016-05-23 DIAGNOSIS — Z130 Encounter for screening for diseases of the blood and blood-forming organs and certain disorders involving the immune mechanism: Secondary | ICD-10-CM | POA: Diagnosis not present

## 2016-05-23 LAB — HM PAP SMEAR

## 2016-05-24 DIAGNOSIS — Z124 Encounter for screening for malignant neoplasm of cervix: Secondary | ICD-10-CM | POA: Diagnosis not present

## 2016-07-05 DIAGNOSIS — Z1211 Encounter for screening for malignant neoplasm of colon: Secondary | ICD-10-CM | POA: Diagnosis not present

## 2016-07-05 DIAGNOSIS — D649 Anemia, unspecified: Secondary | ICD-10-CM | POA: Diagnosis not present

## 2016-07-12 DIAGNOSIS — M25521 Pain in right elbow: Secondary | ICD-10-CM | POA: Diagnosis not present

## 2016-07-12 DIAGNOSIS — S59901A Unspecified injury of right elbow, initial encounter: Secondary | ICD-10-CM | POA: Diagnosis not present

## 2016-07-12 DIAGNOSIS — M7711 Lateral epicondylitis, right elbow: Secondary | ICD-10-CM | POA: Diagnosis not present

## 2016-07-31 ENCOUNTER — Other Ambulatory Visit: Payer: Self-pay | Admitting: Family Medicine

## 2016-07-31 DIAGNOSIS — G43809 Other migraine, not intractable, without status migrainosus: Secondary | ICD-10-CM

## 2016-08-10 DIAGNOSIS — H1851 Endothelial corneal dystrophy: Secondary | ICD-10-CM | POA: Diagnosis not present

## 2016-08-10 DIAGNOSIS — H524 Presbyopia: Secondary | ICD-10-CM | POA: Diagnosis not present

## 2016-08-10 DIAGNOSIS — H5213 Myopia, bilateral: Secondary | ICD-10-CM | POA: Diagnosis not present

## 2016-08-16 ENCOUNTER — Other Ambulatory Visit: Payer: Self-pay | Admitting: Family Medicine

## 2016-09-20 ENCOUNTER — Other Ambulatory Visit: Payer: Self-pay | Admitting: Family Medicine

## 2016-09-20 DIAGNOSIS — G43809 Other migraine, not intractable, without status migrainosus: Secondary | ICD-10-CM

## 2016-09-20 NOTE — Telephone Encounter (Signed)
Faxed to [facility] [address] Thanks TNP

## 2016-12-03 ENCOUNTER — Other Ambulatory Visit: Payer: Self-pay | Admitting: Physician Assistant

## 2016-12-03 DIAGNOSIS — G43809 Other migraine, not intractable, without status migrainosus: Secondary | ICD-10-CM

## 2017-01-04 ENCOUNTER — Other Ambulatory Visit: Payer: Self-pay | Admitting: Family Medicine

## 2017-02-16 ENCOUNTER — Other Ambulatory Visit: Payer: Self-pay | Admitting: Family Medicine

## 2017-02-16 DIAGNOSIS — G43809 Other migraine, not intractable, without status migrainosus: Secondary | ICD-10-CM

## 2017-03-06 ENCOUNTER — Other Ambulatory Visit: Payer: Self-pay | Admitting: Family Medicine

## 2017-04-05 DIAGNOSIS — Z23 Encounter for immunization: Secondary | ICD-10-CM | POA: Diagnosis not present

## 2017-04-24 ENCOUNTER — Other Ambulatory Visit: Payer: Self-pay | Admitting: Family Medicine

## 2017-04-24 DIAGNOSIS — G43809 Other migraine, not intractable, without status migrainosus: Secondary | ICD-10-CM

## 2017-04-29 ENCOUNTER — Other Ambulatory Visit: Payer: Self-pay | Admitting: Family Medicine

## 2017-04-29 DIAGNOSIS — G43809 Other migraine, not intractable, without status migrainosus: Secondary | ICD-10-CM

## 2017-05-23 ENCOUNTER — Other Ambulatory Visit: Payer: Self-pay | Admitting: Family Medicine

## 2017-05-23 NOTE — Telephone Encounter (Signed)
Last OV 05/02/2016

## 2017-05-29 ENCOUNTER — Other Ambulatory Visit: Payer: Self-pay

## 2017-05-29 MED ORDER — LEVOTHYROXINE SODIUM 137 MCG PO TABS: ORAL_TABLET | ORAL | 0 refills | Status: DC

## 2017-05-30 DIAGNOSIS — Z01419 Encounter for gynecological examination (general) (routine) without abnormal findings: Secondary | ICD-10-CM | POA: Diagnosis not present

## 2017-05-30 DIAGNOSIS — Z1389 Encounter for screening for other disorder: Secondary | ICD-10-CM | POA: Diagnosis not present

## 2017-05-30 DIAGNOSIS — Z1231 Encounter for screening mammogram for malignant neoplasm of breast: Secondary | ICD-10-CM | POA: Diagnosis not present

## 2017-05-30 DIAGNOSIS — Z6831 Body mass index (BMI) 31.0-31.9, adult: Secondary | ICD-10-CM | POA: Diagnosis not present

## 2017-05-30 DIAGNOSIS — Z124 Encounter for screening for malignant neoplasm of cervix: Secondary | ICD-10-CM | POA: Diagnosis not present

## 2017-05-30 DIAGNOSIS — Z130 Encounter for screening for diseases of the blood and blood-forming organs and certain disorders involving the immune mechanism: Secondary | ICD-10-CM | POA: Diagnosis not present

## 2017-05-30 DIAGNOSIS — N939 Abnormal uterine and vaginal bleeding, unspecified: Secondary | ICD-10-CM | POA: Diagnosis not present

## 2017-05-30 LAB — HM PAP SMEAR: HM PAP: NEGATIVE

## 2017-05-30 LAB — HM MAMMOGRAPHY

## 2017-06-12 ENCOUNTER — Other Ambulatory Visit: Payer: Self-pay | Admitting: Family Medicine

## 2017-06-25 ENCOUNTER — Ambulatory Visit: Payer: BLUE CROSS/BLUE SHIELD | Admitting: Family Medicine

## 2017-06-25 ENCOUNTER — Encounter: Payer: Self-pay | Admitting: Family Medicine

## 2017-06-25 VITALS — BP 126/80 | HR 80 | Temp 98.0°F | Resp 14 | Wt 181.0 lb

## 2017-06-25 DIAGNOSIS — I10 Essential (primary) hypertension: Secondary | ICD-10-CM | POA: Diagnosis not present

## 2017-06-25 DIAGNOSIS — R1013 Epigastric pain: Secondary | ICD-10-CM

## 2017-06-25 DIAGNOSIS — G4709 Other insomnia: Secondary | ICD-10-CM

## 2017-06-25 DIAGNOSIS — F419 Anxiety disorder, unspecified: Secondary | ICD-10-CM | POA: Diagnosis not present

## 2017-06-25 DIAGNOSIS — M545 Low back pain: Secondary | ICD-10-CM

## 2017-06-25 DIAGNOSIS — G43809 Other migraine, not intractable, without status migrainosus: Secondary | ICD-10-CM

## 2017-06-25 DIAGNOSIS — E039 Hypothyroidism, unspecified: Secondary | ICD-10-CM | POA: Diagnosis not present

## 2017-06-25 MED ORDER — LEVOTHYROXINE SODIUM 137 MCG PO TABS: ORAL_TABLET | ORAL | 11 refills | Status: DC

## 2017-06-25 MED ORDER — OMEPRAZOLE 20 MG PO CPDR: 20.0000 mg | DELAYED_RELEASE_CAPSULE | Freq: Every day | ORAL | 3 refills | Status: DC

## 2017-06-25 MED ORDER — AMLODIPINE BESYLATE 5 MG PO TABS: ORAL_TABLET | ORAL | 11 refills | Status: DC

## 2017-06-25 NOTE — Progress Notes (Deleted)
       Patient: Sheryl Tran Female    DOB: [birth year]   52 y.o.   MRN: [id number] Visit Date: 06/25/2017  Today's Provider: Megan Mansichard Gilbert Jr, MD   Chief Complaint  Patient presents with  . Anxiety  . Hypertension   Subjective:    HPI     Allergies  Allergen Reactions  . Amoxicillin Rash     Current Outpatient Medications:  .  ALPRAZolam (XANAX) 0.5 MG tablet, Take 1 tablet (0.5 mg total) by mouth 2 (two) times daily as needed., Disp: 45 tablet, Rfl: 5 .  amLODipine (NORVASC) 5 MG tablet, TAKE 1 TABLET BY MOUTH EVERY DAY**NEEDS OFFICE VISIT**, Disp: 30 tablet, Rfl: 0 .  amLODipine (NORVASC) 5 MG tablet, TAKE 1 TABLET BY MOUTH EVERY DAY**NEEDS OFFICE VISIT**, Disp: 30 tablet, Rfl: 1 .  buPROPion (WELLBUTRIN XL) 150 MG 24 hr tablet, TAKE 1 TABLET (150 MG TOTAL) BY MOUTH DAILY., Disp: 90 tablet, Rfl: 3 .  butalbital-aspirin-caffeine (FIORINAL) 50-325-40 MG capsule, TAKE 1 TO 2 CAPSULES BY MOUTH EVERY 8 HOURS AS NEEDED, Disp: 45 capsule, Rfl: 0 .  Calcium Carbonate-Vitamin D 600-200 MG-UNIT CAPS, Take 1 tablet by mouth 2 (two) times daily., Disp: , Rfl:  .  fluticasone (FLONASE) 50 MCG/ACT nasal spray, USE 2 SPRAYS IN EACH NOSTRIL DAILY (Patient taking differently: USE 2 SPRAYS IN EACH NOSTRIL PRN), Disp: 16 g, Rfl: 12 .  furosemide (LASIX) 20 MG tablet, TAKE 1 TABLET EVERY DAY AS NEEDED, Disp: 30 tablet, Rfl: 11 .  levothyroxine (SYNTHROID, LEVOTHROID) 137 MCG tablet, TAKE 1 TABLET BY MOUTH EVERY DAY (**NEED LAB/OFFICE VISIT*), Disp: 30 tablet, Rfl: 0 .  MULTIPLE VITAMINS-MINERALS PO, Take 1 tablet by mouth daily., Disp: , Rfl:   Review of Systems  Constitutional: Negative.   HENT: Negative.   Eyes: Negative.   Respiratory: Negative.   Cardiovascular: Negative.   Gastrointestinal: Negative.   Endocrine: Negative.   Genitourinary: Negative.   Musculoskeletal: Negative.   Skin: Negative.   Allergic/Immunologic: Negative.   Neurological: Negative.   Hematological:  Negative.   Psychiatric/Behavioral: Negative.     Social History   Tobacco Use  . Smoking status: Former Games developermoker  . Smokeless tobacco: Never Used  Substance Use Topics  . Alcohol use: Yes    Alcohol/week: 0.0 oz    Comment: OCCASIONALLY   Objective:   There were no vitals taken for this visit. There were no vitals filed for this visit.   Physical Exam      Assessment & Plan:           Megan Mansichard Gilbert Jr, MD  [facility]

## 2017-06-25 NOTE — Progress Notes (Signed)
Patient: Sheryl Tran Female    DOB: 1965-08-18   52 y.o.   MRN: [id number] Visit Date: 06/25/2017  Today's Provider: [name], MD   Chief Complaint  Patient presents with  . Hypertension  . Hypothyroidism   Subjective:    HPI Pt is here for refills on her medications today. She needs her Amlodipine and Levothyroxine. She also would like a note so that she could have a stand up desk at work. She reports that she has to have a note to get one. She reports that she has been having some pain in her right side. She reports that it feels like a dull raw pain that is sometimes associated with meals she reports that she had her gall bladder out several years back.   Hypertension, follow-up:  BP Readings from Last 3 Encounters:  06/25/17 126/80  05/02/16 128/84  03/08/15 107/73    She was last seen for hypertension 1 years ago.  BP at that visit was 128/84. Management since that visit includes none. She reports fair compliance with treatment. She is not having side effects.  She is exercising. She is adherent to low salt diet.   Outside blood pressures are not being checked.  Patient denies chest pain, chest pressure/discomfort, claudication, dyspnea, exertional chest pressure/discomfort, fatigue, irregular heart beat, lower extremity edema, near-syncope, orthopnea, palpitations, paroxysmal nocturnal dyspnea, syncope and tachypnea.   Wt Readings from Last 3 Encounters:  06/25/17 181 lb (82.1 kg)  05/02/16 172 lb 3.2 oz (78.1 kg)  02/24/15 173 lb (78.5 kg)   ------------------------------------------------------------------------       Allergies  Allergen Reactions  . Amoxicillin Rash     Current Outpatient Medications:  .  amLODipine (NORVASC) 5 MG tablet, TAKE 1 TABLET BY MOUTH EVERY DAY**NEEDS OFFICE VISIT**, Disp: 30 tablet, Rfl: 0 .  buPROPion (WELLBUTRIN XL) 150 MG 24 hr tablet, TAKE 1 TABLET (150 MG TOTAL) BY MOUTH DAILY., Disp: 90 tablet, Rfl: 3 .   butalbital-aspirin-caffeine (FIORINAL) 50-325-40 MG capsule, TAKE 1 TO 2 CAPSULES BY MOUTH EVERY 8 HOURS AS NEEDED, Disp: 45 capsule, Rfl: 0 .  Calcium Carbonate-Vitamin D 600-200 MG-UNIT CAPS, Take 1 tablet by mouth 2 (two) times daily., Disp: , Rfl:  .  furosemide (LASIX) 20 MG tablet, TAKE 1 TABLET EVERY DAY AS NEEDED, Disp: 30 tablet, Rfl: 11 .  levothyroxine (SYNTHROID, LEVOTHROID) 137 MCG tablet, TAKE 1 TABLET BY MOUTH EVERY DAY (**NEED LAB/OFFICE VISIT*), Disp: 30 tablet, Rfl: 0 .  MULTIPLE VITAMINS-MINERALS PO, Take 1 tablet by mouth daily., Disp: , Rfl:  .  ALPRAZolam (XANAX) 0.5 MG tablet, Take 1 tablet (0.5 mg total) by mouth 2 (two) times daily as needed. (Patient not taking: Reported on 06/25/2017), Disp: 45 tablet, Rfl: 5  Review of Systems  Constitutional: Negative.   HENT: Negative.   Eyes: Negative.   Respiratory: Negative.   Cardiovascular: Negative.   Gastrointestinal: Negative.   Endocrine: Negative.   Genitourinary: Negative.   Musculoskeletal: Negative.   Skin: Negative.   Allergic/Immunologic: Negative.   Neurological: Negative.   Hematological: Negative.   Psychiatric/Behavioral: Negative.     Social History   Tobacco Use  . Smoking status: Former Games developer  . Smokeless tobacco: Never Used  Substance Use Topics  . Alcohol use: Yes    Alcohol/week: 0.0 oz    Comment: OCCASIONALLY   Objective:   BP 126/80 (BP Location: Left Arm, Patient Position: Sitting, Cuff Size: Large)   Pulse 80  Temp 98 F (36.7 C) (Oral)   Resp 14   Wt 181 lb (82.1 kg)   BMI 32.58 kg/m  Vitals:   06/25/17 1606  BP: 126/80  Pulse: 80  Resp: 14  Temp: 98 F (36.7 C)  TempSrc: Oral  Weight: 181 lb (82.1 kg)     Physical Exam  Constitutional: She is oriented to person, place, and time. She appears well-developed and well-nourished.  HENT:  Head: Normocephalic and atraumatic.  Eyes: Conjunctivae are normal. No scleral icterus.  Neck: No thyromegaly present.    Cardiovascular: Normal rate, regular rhythm and normal heart sounds.  Pulmonary/Chest: Effort normal and breath sounds normal.  Abdominal: Soft.  Neurological: She is alert and oriented to person, place, and time.  Skin: Skin is warm and dry.  Psychiatric: She has a normal mood and affect. Her behavior is normal. Judgment and thought content normal.        Assessment & Plan:     1. Acute anxiety Pt to cut back on alcohol.I have done the exam and reviewed the above chart and it is accurate to the best of my knowledge. DentistDragon  technology has been used in this note in any air is in the dictation or transcription are unintentional.   2. Adult hypothyroidism   3. Hypertension, unspecified type Stable sent in refills for pt.   4. Low back pain, unspecified back pain laterality, unspecified chronicity, with sciatica presence unspecified I think she would benefit from a standing desk.      HPI, Exam, and A&P Transcribed under the direction and in the presence of Richard L. Wendelyn BreslowGilbert Jr, MD  Electronically Signed: Silvio PateBrittany O'Dell, CMA  I have done the exam and reviewed the above chart and it is accurate to the best of my knowledge. DentistDragon  technology has been used in this note in any air is in the dictation or transcription are unintentional.  Megan Mansichard Gilbert Jr, MD  [facility]

## 2017-07-11 ENCOUNTER — Other Ambulatory Visit: Payer: Self-pay

## 2017-07-11 ENCOUNTER — Encounter: Payer: Self-pay | Admitting: Family Medicine

## 2017-07-11 ENCOUNTER — Ambulatory Visit (INDEPENDENT_AMBULATORY_CARE_PROVIDER_SITE_OTHER): Payer: BLUE CROSS/BLUE SHIELD | Admitting: Family Medicine

## 2017-07-11 VITALS — BP 108/72 | HR 72 | Temp 99.2°F | Resp 14 | Ht 62.5 in | Wt 178.2 lb

## 2017-07-11 DIAGNOSIS — R739 Hyperglycemia, unspecified: Secondary | ICD-10-CM | POA: Diagnosis not present

## 2017-07-11 DIAGNOSIS — Z1211 Encounter for screening for malignant neoplasm of colon: Secondary | ICD-10-CM | POA: Diagnosis not present

## 2017-07-11 DIAGNOSIS — I10 Essential (primary) hypertension: Secondary | ICD-10-CM

## 2017-07-11 DIAGNOSIS — E039 Hypothyroidism, unspecified: Secondary | ICD-10-CM

## 2017-07-11 DIAGNOSIS — G43809 Other migraine, not intractable, without status migrainosus: Secondary | ICD-10-CM | POA: Diagnosis not present

## 2017-07-11 DIAGNOSIS — Z0000 Encounter for general adult medical examination without abnormal findings: Secondary | ICD-10-CM | POA: Diagnosis not present

## 2017-07-11 MED ORDER — BUPROPION HCL ER (XL) 150 MG PO TB24: 150.0000 mg | ORAL_TABLET | Freq: Every day | ORAL | 3 refills | Status: DC

## 2017-07-11 NOTE — Progress Notes (Signed)
Patient: Sheryl Tran, Female    DOB: [birth year], 52 y.o.   MRN: [id number] Visit Date: 07/11/2017  Today's Provider: [name], MD   Chief Complaint  Patient presents with  . Annual Exam   Subjective:  Sheryl Tran is a 52 y.o. female who presents today for health maintenance and complete physical. She feels fairly well except feels that she is going through menopause maybe. She reports exercising treadmill-45 minutes, tries to get this done daily. She reports she is sleeping just depends, good and bad at times.  Patient sees Dr Tracey Harries in [place] for gynecology. Patient had pap smear with Dr Ambrose Mantle and mammogram, 05/27/17. Patient is due for Colonoscopy.  Review of Systems  Constitutional: Positive for fatigue.  HENT: Negative.   Eyes: Negative.   Respiratory: Negative.   Cardiovascular: Negative.   Gastrointestinal: Positive for abdominal distention.  Endocrine: Negative.   Genitourinary: Positive for vaginal bleeding (menstrual cycle off and on, irregular.).  Musculoskeletal: Negative.   Skin: Negative.   Allergic/Immunologic: Negative.   Neurological: Positive for headaches.  Hematological: Negative.   Psychiatric/Behavioral: Positive for agitation, dysphoric mood and sleep disturbance. The patient is nervous/anxious.     Social History   Socioeconomic History  . Marital status: Divorced    Spouse name: Not on file  . Number of children: Not on file  . Years of education: Not on file  . Highest education level: Not on file  Social Needs  . Financial resource strain: Not on file  . Food insecurity - worry: Not on file  . Food insecurity - inability: Not on file  . Transportation needs - medical: Not on file  . Transportation needs - non-medical: Not on file  Occupational History  . Not on file  Tobacco Use  . Smoking status: Never Smoker  . Smokeless tobacco: Never Used  Substance and Sexual Activity  . Alcohol use: No    Alcohol/week: 0.0 oz     Frequency: Never  . Drug use: No  . Sexual activity: Yes    Birth control/protection: Implant  Other Topics Concern  . Not on file  Social History Narrative  . Not on file    Patient Active Problem List   Diagnosis Date Noted  . Anxiety 02/23/2015  . Clinical depression 02/23/2015  . BP (high blood pressure) 02/23/2015  . Adult hypothyroidism 02/23/2015  . Cannot sleep 02/23/2015  . Headache, migraine 02/23/2015  . Interdigital neuralgia 02/23/2015  . Adiposity 02/23/2015  . Premenstrual tension syndrome 02/23/2015  . Menstrual molimen 02/23/2015    Past Surgical History:  Procedure Laterality Date  . BREAST LUMPECTOMY    . CHOLECYSTECTOMY  2006    Her family history includes ADD / ADHD in her sister; Allergies in her mother; CVA in her paternal grandmother; Cerebrovascular Disease in her father; Coronary artery disease in her father; Hypertension in her father and mother; Hypothyroidism in her mother; Lung cancer in her maternal grandmother; Prostate cancer in her maternal grandfather.     Outpatient Encounter Medications as of 07/11/2017  Medication Sig Note  . amLODipine (NORVASC) 5 MG tablet TAKE 1 TABLET BY MOUTH EVERY DAY   . buPROPion (WELLBUTRIN XL) 150 MG 24 hr tablet TAKE 1 TABLET (150 MG TOTAL) BY MOUTH DAILY.   . Calcium Carbonate-Vitamin D 600-200 MG-UNIT CAPS Take 1 tablet by mouth 2 (two) times daily. 02/23/2015: Received from: [facility] Received Sig: Take by mouth.  . furosemide (LASIX) 20 MG tablet TAKE 1  TABLET EVERY DAY AS NEEDED   . levothyroxine (SYNTHROID, LEVOTHROID) 137 MCG tablet TAKE 1 TABLET BY MOUTH EVERY DAY   . MULTIPLE VITAMINS-MINERALS PO Take 1 tablet by mouth daily. 02/23/2015: [facility] Received Sig: Take by mouth.  Marland Kitchen. omeprazole (PRILOSEC) 20 MG capsule Take 1 capsule (20 mg total) by mouth daily.   . butalbital-aspirin-caffeine (FIORINAL) 50-325-40 MG capsule TAKE 1 TO 2 CAPSULES BY  MOUTH EVERY 8 HOURS AS NEEDED (Patient not taking: Reported on 07/11/2017)   . [DISCONTINUED] ALPRAZolam (XANAX) 0.5 MG tablet Take 1 tablet (0.5 mg total) by mouth 2 (two) times daily as needed. (Patient not taking: Reported on 06/25/2017)    No facility-administered encounter medications on file as of 07/11/2017.     Patient Care Team: Maple HudsonGilbert, Richard L Jr., MD as PCP - General (Family Medicine)      Objective:   Vitals:  Vitals:   07/11/17 1421  BP: 108/72  Pulse: 72  Resp: 14  Temp: 99.2 F (37.3 C)  Weight: 178 lb 3.2 oz (80.8 kg)  Height: 5' 2.5" (1.588 m)    Physical Exam  Constitutional: She is oriented to person, place, and time. She appears well-developed and well-nourished.  HENT:  Head: Normocephalic and atraumatic.  Right Ear: External ear normal.  Left Ear: External ear normal.  Nose: Nose normal.  Eyes: Conjunctivae are normal. No scleral icterus.  Neck: No thyromegaly present.  Cardiovascular: Normal rate, regular rhythm, normal heart sounds and intact distal pulses.  Pulmonary/Chest: Effort normal and breath sounds normal.  Abdominal: Soft.  Neurological: She is alert and oriented to person, place, and time.  Skin: Skin is warm and dry.  Psychiatric: She has a normal mood and affect. Her behavior is normal. Judgment and thought content normal.     Depression Screen PHQ 2/9 Scores 07/11/2017 06/25/2017  PHQ - 2 Score 1 0  PHQ- 9 Score 3 3   Assessment & Plan:    1. Annual physical exam - CBC with Differential/Platelet - Comprehensive metabolic panel - Lipid Panel With LDL/HDL Ratio - TSH  2. Colon cancer screening - Ambulatory referral to Gastroenterology  3. Adult hypothyroidism  4. Hypertension, unspecified type - CBC with Differential/Platelet - Comprehensive metabolic panel  5. Other migraine without status migrainosus, not intractable  6. Hyperglycemia - HgB A1c    HPI, Exam and A&P transcribed by Domingo CockingAnastasiya Hopkins, RMA under  direction and in the presence of Julieanne Mansonichard Gilbert, MD. I have done the exam and reviewed the chart and it is accurate to the best of my knowledge. DentistDragon  technology has been used and  any errors in dictation or transcription are unintentional. Julieanne Mansonichard Gilbert M.D. [facility]

## 2017-07-16 DIAGNOSIS — Z0000 Encounter for general adult medical examination without abnormal findings: Secondary | ICD-10-CM | POA: Diagnosis not present

## 2017-07-16 DIAGNOSIS — R739 Hyperglycemia, unspecified: Secondary | ICD-10-CM | POA: Diagnosis not present

## 2017-07-16 DIAGNOSIS — I10 Essential (primary) hypertension: Secondary | ICD-10-CM | POA: Diagnosis not present

## 2017-07-17 LAB — COMPREHENSIVE METABOLIC PANEL
ALBUMIN: 4.5 g/dL (ref 3.5–5.5)
ALT: 26 IU/L (ref 0–32)
AST: 26 IU/L (ref 0–40)
Albumin/Globulin Ratio: 1.7 (ref 1.2–2.2)
Alkaline Phosphatase: 79 IU/L (ref 39–117)
BUN / CREAT RATIO: 12 (ref 9–23)
BUN: 9 mg/dL (ref 6–24)
Bilirubin Total: 0.3 mg/dL (ref 0.0–1.2)
CALCIUM: 9.5 mg/dL (ref 8.7–10.2)
CO2: 22 mmol/L (ref 20–29)
CREATININE: 0.76 mg/dL (ref 0.57–1.00)
Chloride: 98 mmol/L (ref 96–106)
GFR calc non Af Amer: 92 mL/min/{1.73_m2} (ref >59)
GFR, EST AFRICAN AMERICAN: 106 mL/min/{1.73_m2} (ref >59)
GLUCOSE: 93 mg/dL (ref 65–99)
Globulin, Total: 2.6 g/dL (ref 1.5–4.5)
Potassium: 4.3 mmol/L (ref 3.5–5.2)
Sodium: 137 mmol/L (ref 134–144)
TOTAL PROTEIN: 7.1 g/dL (ref 6.0–8.5)

## 2017-07-17 LAB — LIPID PANEL WITH LDL/HDL RATIO
Cholesterol, Total: 181 mg/dL (ref 100–199)
HDL: 54 mg/dL (ref >39)
LDL CALC: 95 mg/dL (ref 0–99)
LDL/HDL RATIO: 1.8 ratio (ref 0.0–3.2)
TRIGLYCERIDES: 161 mg/dL — AB (ref 0–149)
VLDL Cholesterol Cal: 32 mg/dL (ref 5–40)

## 2017-07-17 LAB — HEMOGLOBIN A1C
Est. average glucose Bld gHb Est-mCnc: 103 mg/dL
Hgb A1c MFr Bld: 5.2 % (ref 4.8–5.6)

## 2017-07-17 LAB — TSH: TSH: 1.78 u[IU]/mL (ref 0.450–4.500)

## 2017-07-17 LAB — CBC WITH DIFFERENTIAL/PLATELET
BASOS ABS: 0 10*3/uL (ref 0.0–0.2)
Basos: 0 %
EOS (ABSOLUTE): 0.1 10*3/uL (ref 0.0–0.4)
Eos: 2 %
HEMOGLOBIN: 14.4 g/dL (ref 11.1–15.9)
Hematocrit: 42.6 % (ref 34.0–46.6)
IMMATURE GRANS (ABS): 0 10*3/uL (ref 0.0–0.1)
Immature Granulocytes: 0 %
LYMPHS: 29 %
Lymphocytes Absolute: 1.9 10*3/uL (ref 0.7–3.1)
MCH: 31 pg (ref 26.6–33.0)
MCHC: 33.8 g/dL (ref 31.5–35.7)
MCV: 92 fL (ref 79–97)
MONOCYTES: 9 %
Monocytes Absolute: 0.6 10*3/uL (ref 0.1–0.9)
NEUTROS ABS: 4 10*3/uL (ref 1.4–7.0)
Neutrophils: 60 %
Platelets: 241 10*3/uL (ref 150–379)
RBC: 4.65 x10E6/uL (ref 3.77–5.28)
RDW: 13.6 % (ref 12.3–15.4)
WBC: 6.7 10*3/uL (ref 3.4–10.8)

## 2017-07-17 NOTE — Progress Notes (Signed)
Advised  ED 

## 2017-07-30 ENCOUNTER — Telehealth: Payer: Self-pay | Admitting: Family Medicine

## 2017-07-30 NOTE — Telephone Encounter (Signed)
Pt has scheduled appointment with [facility] 08/22/17.Just FYI

## 2017-07-31 NOTE — Telephone Encounter (Signed)
FYI

## 2017-08-05 ENCOUNTER — Other Ambulatory Visit: Payer: Self-pay | Admitting: Family Medicine

## 2017-08-05 DIAGNOSIS — G43809 Other migraine, not intractable, without status migrainosus: Secondary | ICD-10-CM

## 2017-08-06 NOTE — Telephone Encounter (Signed)
Pharmacy requesting refills. Thanks!  

## 2017-08-07 ENCOUNTER — Other Ambulatory Visit: Payer: Self-pay | Admitting: Family Medicine

## 2017-08-07 DIAGNOSIS — G43809 Other migraine, not intractable, without status migrainosus: Secondary | ICD-10-CM

## 2017-09-03 DIAGNOSIS — Z01419 Encounter for gynecological examination (general) (routine) without abnormal findings: Secondary | ICD-10-CM | POA: Diagnosis not present

## 2017-09-16 ENCOUNTER — Encounter: Payer: Self-pay | Admitting: Family Medicine

## 2017-09-16 DIAGNOSIS — Z1211 Encounter for screening for malignant neoplasm of colon: Secondary | ICD-10-CM | POA: Diagnosis not present

## 2017-09-16 DIAGNOSIS — K5730 Diverticulosis of large intestine without perforation or abscess without bleeding: Secondary | ICD-10-CM | POA: Diagnosis not present

## 2017-09-24 ENCOUNTER — Other Ambulatory Visit: Payer: Self-pay | Admitting: Family Medicine

## 2017-09-24 DIAGNOSIS — G43809 Other migraine, not intractable, without status migrainosus: Secondary | ICD-10-CM

## 2017-10-29 ENCOUNTER — Other Ambulatory Visit: Payer: Self-pay | Admitting: Family Medicine

## 2017-10-29 DIAGNOSIS — R1013 Epigastric pain: Secondary | ICD-10-CM

## 2017-11-06 ENCOUNTER — Ambulatory Visit: Payer: BLUE CROSS/BLUE SHIELD | Admitting: Family Medicine

## 2017-11-06 VITALS — BP 140/82 | HR 88 | Temp 98.7°F | Resp 16 | Wt 171.0 lb

## 2017-11-06 DIAGNOSIS — E782 Mixed hyperlipidemia: Secondary | ICD-10-CM

## 2017-11-06 DIAGNOSIS — R748 Abnormal levels of other serum enzymes: Secondary | ICD-10-CM

## 2017-11-06 NOTE — Progress Notes (Signed)
Sheryl Tran  MRN: [id number] DOB: 1966-06-10  Subjective:  HPI The patient is a 52 year old female who presents for discussion of abnormal labs that were done through her work.  Her labs here in January were normal.  Her labs in  April revealed elevated liver enzymes and elevated lipids.   The patient admits that the night prior to the lab draw she had been out and had a rich meal and several alcoholic drinks.  Patient Active Problem List   Diagnosis Date Noted  . Anxiety 02/23/2015  . Clinical depression 02/23/2015  . BP (high blood pressure) 02/23/2015  . Adult hypothyroidism 02/23/2015  . Cannot sleep 02/23/2015  . Headache, migraine 02/23/2015  . Interdigital neuralgia 02/23/2015  . Adiposity 02/23/2015  . Premenstrual tension syndrome 02/23/2015  . Menstrual molimen 02/23/2015    No past medical history on file.  Social History   Socioeconomic History  . Marital status: Divorced    Spouse name: Not on file  . Number of children: Not on file  . Years of education: Not on file  . Highest education level: Not on file  Occupational History  . Not on file  Social Needs  . Financial resource strain: Not on file  . Food insecurity:    Worry: Not on file    Inability: Not on file  . Transportation needs:    Medical: Not on file    Non-medical: Not on file  Tobacco Use  . Smoking status: Never Smoker  . Smokeless tobacco: Never Used  Substance and Sexual Activity  . Alcohol use: No    Alcohol/week: 0.0 oz    Frequency: Never  . Drug use: No  . Sexual activity: Yes    Birth control/protection: Implant  Lifestyle  . Physical activity:    Days per week: Not on file    Minutes per session: Not on file  . Stress: Not on file  Relationships  . Social connections:    Talks on phone: Not on file    Gets together: Not on file    Attends religious service: Not on file    Active member of club or organization: Not on file    Attends meetings of clubs or  organizations: Not on file    Relationship status: Not on file  . Intimate partner violence:    Fear of current or ex partner: Not on file    Emotionally abused: Not on file    Physically abused: Not on file    Forced sexual activity: Not on file  Other Topics Concern  . Not on file  Social History Narrative  . Not on file    Outpatient Encounter Medications as of 11/06/2017  Medication Sig Note  . amLODipine (NORVASC) 5 MG tablet TAKE 1 TABLET BY MOUTH EVERY DAY   . buPROPion (WELLBUTRIN XL) 150 MG 24 hr tablet Take 1 tablet (150 mg total) by mouth daily.   . butalbital-aspirin-caffeine (FIORINAL) 50-325-40 MG capsule TAKE 1 TO 2 CAPSULES BY MOUTH EVERY 8 HOURS AS NEEDED   . Calcium Carbonate-Vitamin D 600-200 MG-UNIT CAPS Take 1 tablet by mouth 2 (two) times daily. 02/23/2015: Received from: [facility] Received Sig: Take by mouth.  . furosemide (LASIX) 20 MG tablet TAKE 1 TABLET EVERY DAY AS NEEDED   . levothyroxine (SYNTHROID, LEVOTHROID) 137 MCG tablet TAKE 1 TABLET BY MOUTH EVERY DAY   . MULTIPLE VITAMINS-MINERALS PO Take 1 tablet by mouth daily. 02/23/2015: Received from: [facility] Received Sig: Take by mouth.  Marland Kitchen omeprazole (PRILOSEC) 20 MG capsule TAKE 1 CAPSULE BY MOUTH EVERY DAY    No facility-administered encounter medications on file as of 11/06/2017.     Allergies  Allergen Reactions  . Amoxicillin Rash    Review of Systems  Constitutional: Negative for fever, malaise/fatigue and weight loss.  Respiratory: Negative for cough, shortness of breath and wheezing.   Cardiovascular: Negative for chest pain, palpitations and orthopnea.  Gastrointestinal: Negative.   Neurological: Negative for dizziness and headaches.  Psychiatric/Behavioral: Negative.     Objective:  BP 140/82 (BP Location: Right Arm, Patient Position: Sitting, Cuff Size: Normal)   Pulse 88   Temp 98.7 F (37.1 C) (Oral)   Resp 16   Wt 171 lb (77.6 kg)   BMI 30.78  kg/m   Physical Exam  Constitutional: She is oriented to person, place, and time and well-developed, well-nourished, and in no distress.  HENT:  Head: Normocephalic and atraumatic.  Eyes: Conjunctivae are normal. No scleral icterus.  Neck: No thyromegaly present.  Cardiovascular: Normal rate, regular rhythm and normal heart sounds.  Pulmonary/Chest: Effort normal and breath sounds normal.  Abdominal: Soft. There is no tenderness.  Lymphadenopathy:    She has no cervical adenopathy.  Neurological: She is alert and oriented to person, place, and time. Gait normal. GCS score is 15.  Skin: Skin is warm and dry.  Psychiatric: Mood, memory, affect and judgment normal.    Assessment and Plan :  1. Elevated triglycerides with high cholesterol   2. Elevated liver enzymes Pt trying to stop all drinking. RTC 1 month  I have done the exam and reviewed the chart and it is accurate to the best of my knowledge. Dentist has been used and  any errors in dictation or transcription are unintentional. Julieanne Manson M.D. [facility]

## 2017-11-22 ENCOUNTER — Other Ambulatory Visit: Payer: Self-pay | Admitting: Family Medicine

## 2017-11-22 DIAGNOSIS — G43809 Other migraine, not intractable, without status migrainosus: Secondary | ICD-10-CM

## 2017-12-09 ENCOUNTER — Ambulatory Visit: Payer: Self-pay | Admitting: Family Medicine

## 2017-12-18 ENCOUNTER — Other Ambulatory Visit: Payer: Self-pay | Admitting: Family Medicine

## 2017-12-18 DIAGNOSIS — R1013 Epigastric pain: Secondary | ICD-10-CM

## 2017-12-24 ENCOUNTER — Ambulatory Visit: Payer: Self-pay | Admitting: Family Medicine

## 2017-12-30 ENCOUNTER — Ambulatory Visit: Payer: Self-pay | Admitting: Family Medicine

## 2018-01-31 ENCOUNTER — Other Ambulatory Visit: Payer: Self-pay | Admitting: Family Medicine

## 2018-01-31 DIAGNOSIS — G43809 Other migraine, not intractable, without status migrainosus: Secondary | ICD-10-CM

## 2018-03-18 ENCOUNTER — Other Ambulatory Visit: Payer: Self-pay | Admitting: Family Medicine

## 2018-03-18 DIAGNOSIS — G43809 Other migraine, not intractable, without status migrainosus: Secondary | ICD-10-CM

## 2018-05-21 ENCOUNTER — Ambulatory Visit: Payer: BLUE CROSS/BLUE SHIELD | Admitting: Family Medicine

## 2018-05-21 VITALS — BP 130/90 | HR 93 | Temp 98.8°F | Resp 16 | Wt 165.0 lb

## 2018-05-21 DIAGNOSIS — N951 Menopausal and female climacteric states: Secondary | ICD-10-CM

## 2018-05-21 DIAGNOSIS — G43809 Other migraine, not intractable, without status migrainosus: Secondary | ICD-10-CM

## 2018-05-21 DIAGNOSIS — I889 Nonspecific lymphadenitis, unspecified: Secondary | ICD-10-CM | POA: Diagnosis not present

## 2018-05-21 MED ORDER — GABAPENTIN 100 MG PO CAPS: 100.0000 mg | ORAL_CAPSULE | Freq: Every day | ORAL | 3 refills | Status: DC

## 2018-05-21 MED ORDER — AZITHROMYCIN 250 MG PO TABS: ORAL_TABLET | ORAL | 0 refills | Status: DC

## 2018-05-21 NOTE — Progress Notes (Signed)
Sheryl Tran  MRN: [id number] DOB: 1966-06-17  Subjective:  HPI   The patient is a 52 year old female who presents for evaluation of a lump on the right side of her neck.  She states she noticed it yesterday.  She has some tenderness to the touch.   The only infectious symptoms she reports is that she had a sty on her left eyelid last week. She is also having some menopausal symptoms he wishes to discuss. Patient Active Problem List   Diagnosis Date Noted  . Anxiety 02/23/2015  . Clinical depression 02/23/2015  . BP (high blood pressure) 02/23/2015  . Adult hypothyroidism 02/23/2015  . Cannot sleep 02/23/2015  . Headache, migraine 02/23/2015  . Interdigital neuralgia 02/23/2015  . Adiposity 02/23/2015  . Premenstrual tension syndrome 02/23/2015  . Menstrual molimen 02/23/2015    No past medical history on file.  Social History   Socioeconomic History  . Marital status: Divorced    Spouse name: Not on file  . Number of children: Not on file  . Years of education: Not on file  . Highest education level: Not on file  Occupational History  . Not on file  Social Needs  . Financial resource strain: Not on file  . Food insecurity:    Worry: Not on file    Inability: Not on file  . Transportation needs:    Medical: Not on file    Non-medical: Not on file  Tobacco Use  . Smoking status: Never Smoker  . Smokeless tobacco: Never Used  Substance and Sexual Activity  . Alcohol use: No    Alcohol/week: 0.0 standard drinks    Frequency: Never  . Drug use: No  . Sexual activity: Yes    Birth control/protection: Implant  Lifestyle  . Physical activity:    Days per week: Not on file    Minutes per session: Not on file  . Stress: Not on file  Relationships  . Social connections:    Talks on phone: Not on file    Gets together: Not on file    Attends religious service: Not on file    Active member of club or organization: Not on file    Attends meetings of clubs or  organizations: Not on file    Relationship status: Not on file  . Intimate partner violence:    Fear of current or ex partner: Not on file    Emotionally abused: Not on file    Physically abused: Not on file    Forced sexual activity: Not on file  Other Topics Concern  . Not on file  Social History Narrative  . Not on file    Outpatient Encounter Medications as of 05/21/2018  Medication Sig Note  . amLODipine (NORVASC) 5 MG tablet TAKE 1 TABLET BY MOUTH EVERY DAY   . buPROPion (WELLBUTRIN XL) 150 MG 24 hr tablet Take 1 tablet (150 mg total) by mouth daily.   . butalbital-aspirin-caffeine (FIORINAL) 50-325-40 MG capsule TAKE 1 TO 2 CAPSULES BY MOUTH EVERY 8 HOURS AS NEEDED   . butalbital-aspirin-caffeine (FIORINAL) 50-325-40 MG capsule TAKE 1 TO 2 CAPSULES BY MOUTH EVERY 8 HOURS AS NEEDED   . Calcium Carbonate-Vitamin D 600-200 MG-UNIT CAPS Take 1 tablet by mouth 2 (two) times daily. 02/23/2015: [facility] Received Sig: Take by mouth.  . furosemide (LASIX) 20 MG tablet TAKE 1 TABLET EVERY DAY AS NEEDED   . levothyroxine (SYNTHROID, LEVOTHROID) 137 MCG tablet TAKE 1  TABLET BY MOUTH EVERY DAY   . MULTIPLE VITAMINS-MINERALS PO Take 1 tablet by mouth daily. 02/23/2015: Received from: [employer] Received Sig: Take by mouth.  Marland Kitchen omeprazole (PRILOSEC) 20 MG capsule TAKE 1 CAPSULE BY MOUTH EVERY DAY    No facility-administered encounter medications on file as of 05/21/2018.     Allergies  Allergen Reactions  . Amoxicillin Rash    Review of Systems  Constitutional: Negative for fever.  HENT: Negative for congestion, sinus pain and sore throat.   Eyes: Negative.   Respiratory: Negative for cough, shortness of breath and wheezing.   Cardiovascular: Positive for palpitations. Negative for chest pain.  Gastrointestinal: Negative.   Endo/Heme/Allergies: Negative.   Psychiatric/Behavioral: Negative.     Objective:  BP 130/90 (BP Location:  Right Arm, Patient Position: Sitting, Cuff Size: Normal)   Pulse 93   Temp 98.8 F (37.1 C) (Oral)   Resp 16   Wt 165 lb (74.8 kg)   BMI 29.70 kg/m   Physical Exam  Constitutional: She is oriented to person, place, and time and well-developed, well-nourished, and in no distress.  HENT:  Head: Normocephalic and atraumatic.  Right Ear: External ear normal.  Left Ear: External ear normal.  Nose: Nose normal.  Eyes: No scleral icterus.  Neck: No thyromegaly present.  Mild anterior cervical lymph node which is mildly tender and mobile  Cardiovascular: Normal rate, regular rhythm and normal heart sounds.  Pulmonary/Chest: Effort normal and breath sounds normal.  Abdominal: Soft.  Musculoskeletal: She exhibits no edema.  Lymphadenopathy:    She has cervical adenopathy.  Neurological: She is alert and oriented to person, place, and time. Gait normal. GCS score is 15.  Skin: Skin is warm and dry.  Psychiatric: Mood, memory, affect and judgment normal.    Assessment and Plan :   1. Lymphadenitis  - azithromycin (ZITHROMAX) 250 MG tablet; 2 PO day 1, then 1 PO q daily  Dispense: 6 tablet; Refill: 0  2. Other migraine without status migrainosus, not intractable  - gabapentin (NEURONTIN) 100 MG capsule; Take 1 capsule (100 mg total) by mouth at bedtime.  Dispense: 30 capsule; Refill: 3  3. Menopausal symptoms Patient wishes to try gabapentin for this although I think it will be unsuccessful.  I think it is safe enough to try. - gabapentin (NEURONTIN) 100 MG capsule; Take 1 capsule (100 mg total) by mouth at bedtime.  Dispense: 30 capsule; Refill: 3   HPI, Exam and A&P Transcribed under the direction and in the presence of Julieanne Manson, Montez Hageman., MD. Electronically Signed: Janey Greaser, RMA I have done the exam and reviewed the chart and it is accurate to the best of my knowledge. Dentist has been used and  any errors in dictation or transcription are  unintentional. Julieanne Manson M.D. [facility]

## 2018-05-27 ENCOUNTER — Ambulatory Visit: Payer: BLUE CROSS/BLUE SHIELD | Admitting: Family Medicine

## 2018-06-03 ENCOUNTER — Other Ambulatory Visit: Payer: Self-pay | Admitting: Family Medicine

## 2018-06-03 DIAGNOSIS — E039 Hypothyroidism, unspecified: Secondary | ICD-10-CM

## 2018-06-12 ENCOUNTER — Other Ambulatory Visit: Payer: Self-pay | Admitting: Family Medicine

## 2018-06-12 DIAGNOSIS — G43809 Other migraine, not intractable, without status migrainosus: Secondary | ICD-10-CM

## 2018-06-12 DIAGNOSIS — N951 Menopausal and female climacteric states: Secondary | ICD-10-CM

## 2018-06-16 ENCOUNTER — Other Ambulatory Visit: Payer: Self-pay | Admitting: Family Medicine

## 2018-06-16 DIAGNOSIS — G43809 Other migraine, not intractable, without status migrainosus: Secondary | ICD-10-CM

## 2018-06-17 ENCOUNTER — Other Ambulatory Visit: Payer: Self-pay | Admitting: Family Medicine

## 2018-06-22 ENCOUNTER — Other Ambulatory Visit: Payer: Self-pay | Admitting: Family Medicine

## 2018-06-22 DIAGNOSIS — I10 Essential (primary) hypertension: Secondary | ICD-10-CM

## 2018-07-15 ENCOUNTER — Other Ambulatory Visit: Payer: Self-pay

## 2018-07-15 MED ORDER — OSELTAMIVIR PHOSPHATE 75 MG PO CAPS: 75.0000 mg | ORAL_CAPSULE | Freq: Every day | ORAL | 0 refills | Status: DC

## 2018-07-22 ENCOUNTER — Encounter: Payer: Self-pay | Admitting: Family Medicine

## 2018-07-22 ENCOUNTER — Ambulatory Visit (INDEPENDENT_AMBULATORY_CARE_PROVIDER_SITE_OTHER): Payer: BLUE CROSS/BLUE SHIELD | Admitting: Family Medicine

## 2018-07-22 VITALS — BP 122/82 | HR 85 | Temp 98.8°F | Ht 63.0 in | Wt 171.6 lb

## 2018-07-22 DIAGNOSIS — Z0000 Encounter for general adult medical examination without abnormal findings: Secondary | ICD-10-CM

## 2018-07-22 DIAGNOSIS — F419 Anxiety disorder, unspecified: Secondary | ICD-10-CM | POA: Diagnosis not present

## 2018-07-22 DIAGNOSIS — G43809 Other migraine, not intractable, without status migrainosus: Secondary | ICD-10-CM

## 2018-07-22 DIAGNOSIS — E039 Hypothyroidism, unspecified: Secondary | ICD-10-CM

## 2018-07-22 MED ORDER — BUPROPION HCL ER (XL) 150 MG PO TB24: 300.0000 mg | ORAL_TABLET | Freq: Every day | ORAL | 3 refills | Status: DC

## 2018-07-22 NOTE — Progress Notes (Signed)
Patient: Sheryl Tran, Female    DOB: 5/11/Sheryl Manly1967, 53 y.o.   MRN: [id number] Visit Date: 07/22/2018  Today's Provider: Megan Mansichard Kanesha Cadle Jr, MD   Chief Complaint  Patient presents with  . Annual Exam   Subjective:    Annual physical exam Sheryl ManlyJill B Tran is a 53 y.o. female who presents today for health maintenance and complete physical. She feels fairly well. She reports exercising lightly. She reports she is sleeping well.  ----------------------------------------------------------------- Last Mammogram: 05/30/2017 Last Colonoscopy: 09/16/2017 Last Pap: 05/30/2017  Review of Systems  Constitutional: Negative.   HENT: Negative.   Eyes: Negative.   Respiratory: Negative.   Cardiovascular: Negative.   Gastrointestinal: Negative.   Endocrine: Negative.   Genitourinary: Negative.   Musculoskeletal: Negative.   Skin: Negative.   Allergic/Immunologic: Negative.   Neurological: Negative.   Hematological: Negative.   Psychiatric/Behavioral: Negative.     Social History She  reports that she has never smoked. She has never used smokeless tobacco. She reports that she does not drink alcohol or use drugs. Social History   Socioeconomic History  . Marital status: Divorced    Spouse name: Not on file  . Number of children: Not on file  . Years of education: Not on file  . Highest education level: Not on file  Occupational History  . Not on file  Social Needs  . Financial resource strain: Not on file  . Food insecurity:    Worry: Not on file    Inability: Not on file  . Transportation needs:    Medical: Not on file    Non-medical: Not on file  Tobacco Use  . Smoking status: Never Smoker  . Smokeless tobacco: Never Used  Substance and Sexual Activity  . Alcohol use: No    Alcohol/week: 0.0 standard drinks    Frequency: Never  . Drug use: No  . Sexual activity: Yes    Birth control/protection: Implant  Lifestyle  . Physical activity:    Days per week: Not on file   Minutes per session: Not on file  . Stress: Not on file  Relationships  . Social connections:    Talks on phone: Not on file    Gets together: Not on file    Attends religious service: Not on file    Active member of club or organization: Not on file    Attends meetings of clubs or organizations: Not on file    Relationship status: Not on file  Other Topics Concern  . Not on file  Social History Narrative  . Not on file    Patient Active Problem List   Diagnosis Date Noted  . Anxiety 02/23/2015  . Clinical depression 02/23/2015  . BP (high blood pressure) 02/23/2015  . Adult hypothyroidism 02/23/2015  . Cannot sleep 02/23/2015  . Headache, migraine 02/23/2015  . Interdigital neuralgia 02/23/2015  . Adiposity 02/23/2015  . Premenstrual tension syndrome 02/23/2015  . Menstrual molimen 02/23/2015    Past Surgical History:  Procedure Laterality Date  . BREAST LUMPECTOMY    . CHOLECYSTECTOMY  2006    Family History  Family Status  Relation Name Status  . Mother  Alive  . Father  Alive  . Sister  Alive  . MGM  (Not Specified)  . MGF  (Not Specified)  . PGM  (Not Specified)   Her family history includes ADD / ADHD in her sister; Allergies in her mother; CVA in her paternal grandmother; Cerebrovascular Disease in her father; Coronary  artery disease in her father; Hypertension in her father and mother; Hypothyroidism in her mother; Lung cancer in her maternal grandmother; Prostate cancer in her maternal grandfather.     Allergies  Allergen Reactions  . Amoxicillin Rash    Previous Medications   AMLODIPINE (NORVASC) 5 MG TABLET    TAKE 1 TABLET BY MOUTH EVERY DAY   BUPROPION (WELLBUTRIN XL) 150 MG 24 HR TABLET    Take 1 tablet (150 mg total) by mouth daily.   BUTALBITAL-ASPIRIN-CAFFEINE (FIORINAL) 50-325-40 MG CAPSULE    TAKE 1 TO 2 CAPSULES BY MOUTH EVERY 8 HOURS AS NEEDED   CALCIUM CARBONATE-VITAMIN D 600-200 MG-UNIT CAPS    Take 1 tablet by mouth 2 (two) times daily.    FUROSEMIDE (LASIX) 20 MG TABLET    TAKE 1 TABLET EVERY DAY AS NEEDED   GABAPENTIN (NEURONTIN) 100 MG CAPSULE    TAKE 1 CAPSULE (100 MG TOTAL) BY MOUTH AT BEDTIME.   LEVOTHYROXINE (SYNTHROID, LEVOTHROID) 137 MCG TABLET    TAKE 1 TABLET BY MOUTH EVERY DAY   MULTIPLE VITAMINS-MINERALS PO    Take 1 tablet by mouth daily.   OMEPRAZOLE (PRILOSEC) 20 MG CAPSULE    TAKE 1 CAPSULE BY MOUTH EVERY DAY    Patient Care Team: Maple HudsonGilbert, Royden Bulman L Jr., MD as PCP - General (Family Medicine)      Objective:   Vitals: BP 122/82 (BP Location: Right Arm, Patient Position: Sitting, Cuff Size: Normal)   Pulse 85   Temp 98.8 F (37.1 C) (Oral)   Ht 5\' 3"  (1.6 m)   Wt 171 lb 9.6 oz (77.8 kg)   SpO2 100%   BMI 30.40 kg/m    Physical Exam Constitutional:      Appearance: Normal appearance. She is well-developed.  HENT:     Head: Normocephalic and atraumatic.     Right Ear: External ear normal.     Left Ear: External ear normal.     Nose: Nose normal.     Mouth/Throat:     Pharynx: Oropharynx is clear.  Eyes:     General: No scleral icterus.    Conjunctiva/sclera: Conjunctivae normal.  Neck:     Thyroid: No thyromegaly.  Cardiovascular:     Rate and Rhythm: Normal rate and regular rhythm.     Heart sounds: Normal heart sounds.  Pulmonary:     Effort: Pulmonary effort is normal.     Breath sounds: Normal breath sounds.  Abdominal:     Palpations: Abdomen is soft.  Genitourinary:    Comments: Irregular menses. Musculoskeletal:     Right lower leg: No edema.     Left lower leg: No edema.  Lymphadenopathy:     Cervical: No cervical adenopathy.  Skin:    General: Skin is warm and dry.  Neurological:     General: No focal deficit present.     Mental Status: She is alert and oriented to person, place, and time. Mental status is at baseline.  Psychiatric:        Mood and Affect: Mood normal.        Behavior: Behavior normal.        Thought Content: Thought content normal.        Judgment:  Judgment normal.      Depression Screen PHQ 2/9 Scores 07/11/2017 06/25/2017  PHQ - 2 Score 1 0  PHQ- 9 Score 3 3      Assessment & Plan:     Routine Health Maintenance and Physical Exam  Exercise Activities and Dietary recommendations Goals   None     Immunization History  Administered Date(s) Administered  . Influenza Whole 04/18/2017  . Influenza,inj,Quad PF,6+ Mos 03/12/2015, 03/05/2016, 04/05/2017, 03/03/2018  . Tdap 10/07/2008    Health Maintenance  Topic Date Due  . HIV Screening  10/26/1981  . TETANUS/TDAP  10/08/2018  . MAMMOGRAM  05/31/2019  . PAP SMEAR-Modifier  05/30/2020  . COLONOSCOPY  09/17/2027  . INFLUENZA VACCINE  Completed    Gyn per Dr Ambrose Mantle. Discussed health benefits of physical activity, and encouraged her to engage in regular exercise appropriate for her age and condition.  1. Encounter for annual physical exam  - CBC with Differential - Comprehensive Metabolic Panel (CMET) - TSH - Lipid Profile - HIV Antibody (routine testing w rflx)  2. Anxiety   3. Adult hypothyroidism   4. Other migraine without status migrainosus, not intractable     -------------------------------------------------------------------- I have done the exam and reviewed the chart and it is accurate to the best of my knowledge. Dentist has been used and  any errors in dictation or transcription are unintentional. Julieanne Manson M.D. [facility]

## 2018-07-30 DIAGNOSIS — Z130 Encounter for screening for diseases of the blood and blood-forming organs and certain disorders involving the immune mechanism: Secondary | ICD-10-CM | POA: Diagnosis not present

## 2018-07-30 DIAGNOSIS — Z1389 Encounter for screening for other disorder: Secondary | ICD-10-CM | POA: Diagnosis not present

## 2018-07-30 DIAGNOSIS — Z1231 Encounter for screening mammogram for malignant neoplasm of breast: Secondary | ICD-10-CM | POA: Diagnosis not present

## 2018-07-30 DIAGNOSIS — Z01419 Encounter for gynecological examination (general) (routine) without abnormal findings: Secondary | ICD-10-CM | POA: Diagnosis not present

## 2018-07-31 DIAGNOSIS — Z124 Encounter for screening for malignant neoplasm of cervix: Secondary | ICD-10-CM | POA: Diagnosis not present

## 2018-07-31 DIAGNOSIS — Z0000 Encounter for general adult medical examination without abnormal findings: Secondary | ICD-10-CM | POA: Diagnosis not present

## 2018-08-01 LAB — CBC WITH DIFFERENTIAL/PLATELET
Basophils Absolute: 0.1 10*3/uL (ref 0.0–0.2)
Basos: 1 %
EOS (ABSOLUTE): 0.1 10*3/uL (ref 0.0–0.4)
Eos: 1 %
Hematocrit: 39.8 % (ref 34.0–46.6)
Hemoglobin: 12.8 g/dL (ref 11.1–15.9)
Immature Grans (Abs): 0 10*3/uL (ref 0.0–0.1)
Immature Granulocytes: 0 %
LYMPHS ABS: 1.9 10*3/uL (ref 0.7–3.1)
Lymphs: 27 %
MCH: 28.2 pg (ref 26.6–33.0)
MCHC: 32.2 g/dL (ref 31.5–35.7)
MCV: 88 fL (ref 79–97)
Monocytes Absolute: 0.7 10*3/uL (ref 0.1–0.9)
Monocytes: 10 %
Neutrophils Absolute: 4.1 10*3/uL (ref 1.4–7.0)
Neutrophils: 61 %
Platelets: 359 10*3/uL (ref 150–450)
RBC: 4.54 x10E6/uL (ref 3.77–5.28)
RDW: 13 % (ref 11.7–15.4)
WBC: 6.8 10*3/uL (ref 3.4–10.8)

## 2018-08-01 LAB — COMPREHENSIVE METABOLIC PANEL
ALT: 25 IU/L (ref 0–32)
AST: 21 IU/L (ref 0–40)
Albumin/Globulin Ratio: 2 (ref 1.2–2.2)
Albumin: 4.6 g/dL (ref 3.8–4.9)
Alkaline Phosphatase: 91 IU/L (ref 39–117)
BUN/Creatinine Ratio: 15 (ref 9–23)
BUN: 11 mg/dL (ref 6–24)
Bilirubin Total: 0.4 mg/dL (ref 0.0–1.2)
CO2: 21 mmol/L (ref 20–29)
Calcium: 9.4 mg/dL (ref 8.7–10.2)
Chloride: 99 mmol/L (ref 96–106)
Creatinine, Ser: 0.74 mg/dL (ref 0.57–1.00)
GFR calc Af Amer: 108 mL/min/{1.73_m2} (ref >59)
GFR calc non Af Amer: 94 mL/min/{1.73_m2} (ref >59)
Globulin, Total: 2.3 g/dL (ref 1.5–4.5)
Glucose: 89 mg/dL (ref 65–99)
Potassium: 4.1 mmol/L (ref 3.5–5.2)
Sodium: 136 mmol/L (ref 134–144)
TOTAL PROTEIN: 6.9 g/dL (ref 6.0–8.5)

## 2018-08-01 LAB — HIV ANTIBODY (ROUTINE TESTING W REFLEX): HIV Screen 4th Generation wRfx: NONREACTIVE

## 2018-08-01 LAB — LIPID PANEL
Chol/HDL Ratio: 3.2 ratio (ref 0.0–4.4)
Cholesterol, Total: 172 mg/dL (ref 100–199)
HDL: 54 mg/dL (ref >39)
LDL Calculated: 90 mg/dL (ref 0–99)
Triglycerides: 140 mg/dL (ref 0–149)
VLDL Cholesterol Cal: 28 mg/dL (ref 5–40)

## 2018-08-01 LAB — TSH: TSH: 4.13 u[IU]/mL (ref 0.450–4.500)

## 2018-08-11 ENCOUNTER — Other Ambulatory Visit: Payer: Self-pay | Admitting: Obstetrics and Gynecology

## 2018-08-11 DIAGNOSIS — R928 Other abnormal and inconclusive findings on diagnostic imaging of breast: Secondary | ICD-10-CM

## 2018-08-13 ENCOUNTER — Ambulatory Visit
Admission: RE | Admit: 2018-08-13 | Discharge: 2018-08-13 | Disposition: A | Discharge status: Home / Self Care | Payer: BLUE CROSS/BLUE SHIELD | Source: Ambulatory Visit | Attending: Obstetrics and Gynecology | Admitting: Obstetrics and Gynecology

## 2018-08-13 DIAGNOSIS — R922 Inconclusive mammogram: Secondary | ICD-10-CM | POA: Diagnosis not present

## 2018-08-13 DIAGNOSIS — N6012 Diffuse cystic mastopathy of left breast: Secondary | ICD-10-CM | POA: Diagnosis not present

## 2018-08-13 DIAGNOSIS — R928 Other abnormal and inconclusive findings on diagnostic imaging of breast: Secondary | ICD-10-CM

## 2018-08-15 ENCOUNTER — Other Ambulatory Visit: Payer: Self-pay | Admitting: Family Medicine

## 2018-10-10 ENCOUNTER — Telehealth: Payer: Self-pay

## 2018-10-10 NOTE — Telephone Encounter (Signed)
Patient is requesting to increase Gabapentin 100 mg to 300 mg due to difficulty sleeping. CB#[phone].

## 2018-10-13 MED ORDER — BUPROPION HCL ER (XL) 300 MG PO TB24: 300.0000 mg | ORAL_TABLET | Freq: Every day | ORAL | 5 refills | Status: DC

## 2018-10-13 NOTE — Telephone Encounter (Signed)
Ok to do this 

## 2018-10-13 NOTE — Telephone Encounter (Signed)
Sent medication into the pharmacy.  

## 2018-10-13 NOTE — Telephone Encounter (Signed)
Please review. Thanks!  

## 2018-10-14 ENCOUNTER — Telehealth: Payer: Self-pay

## 2018-10-14 NOTE — Telephone Encounter (Signed)
Patient was advised.  

## 2018-10-14 NOTE — Telephone Encounter (Signed)
Take 2 at bedtime.

## 2018-10-14 NOTE — Telephone Encounter (Signed)
Patient is requesting an increase on her Gabapentin.

## 2018-10-15 DIAGNOSIS — H0014 Chalazion left upper eyelid: Secondary | ICD-10-CM | POA: Diagnosis not present

## 2018-10-20 ENCOUNTER — Ambulatory Visit: Payer: Self-pay | Admitting: Family Medicine

## 2018-11-09 ENCOUNTER — Other Ambulatory Visit: Payer: Self-pay | Admitting: Family Medicine

## 2018-12-15 ENCOUNTER — Other Ambulatory Visit: Payer: Self-pay | Admitting: Family Medicine

## 2018-12-15 DIAGNOSIS — R1013 Epigastric pain: Secondary | ICD-10-CM

## 2019-02-28 ENCOUNTER — Other Ambulatory Visit: Payer: Self-pay | Admitting: Family Medicine

## 2019-02-28 DIAGNOSIS — N951 Menopausal and female climacteric states: Secondary | ICD-10-CM

## 2019-02-28 DIAGNOSIS — G43809 Other migraine, not intractable, without status migrainosus: Secondary | ICD-10-CM

## 2019-04-30 ENCOUNTER — Telehealth: Payer: Self-pay | Admitting: Family Medicine

## 2019-04-30 ENCOUNTER — Ambulatory Visit: Payer: BC Managed Care – PPO | Admitting: Physician Assistant

## 2019-04-30 ENCOUNTER — Encounter: Payer: Self-pay | Admitting: Physician Assistant

## 2019-04-30 ENCOUNTER — Other Ambulatory Visit: Payer: Self-pay

## 2019-04-30 VITALS — BP 144/90 | HR 78 | Temp 97.3°F | Resp 16 | Wt 173.0 lb

## 2019-04-30 DIAGNOSIS — F5101 Primary insomnia: Secondary | ICD-10-CM

## 2019-04-30 DIAGNOSIS — N3000 Acute cystitis without hematuria: Secondary | ICD-10-CM | POA: Diagnosis not present

## 2019-04-30 DIAGNOSIS — R300 Dysuria: Secondary | ICD-10-CM

## 2019-04-30 LAB — POCT URINALYSIS DIPSTICK
Bilirubin, UA: NEGATIVE
Glucose, UA: NEGATIVE
Ketones, UA: NEGATIVE
Leukocytes, UA: NEGATIVE
Nitrite, UA: NEGATIVE
Protein, UA: NEGATIVE
Spec Grav, UA: 1.01 (ref 1.010–1.025)
Urobilinogen, UA: 0.2 E.U./dL
pH, UA: 6.5 (ref 5.0–8.0)

## 2019-04-30 MED ORDER — ESZOPICLONE 1 MG PO TABS: 1.0000 mg | ORAL_TABLET | Freq: Every evening | ORAL | 0 refills | Status: DC | PRN

## 2019-04-30 MED ORDER — SULFAMETHOXAZOLE-TRIMETHOPRIM 800-160 MG PO TABS: 1.0000 | ORAL_TABLET | Freq: Two times a day (BID) | ORAL | 0 refills | Status: DC

## 2019-04-30 NOTE — Progress Notes (Signed)
Patient: Sheryl Tran Female    DOB: 06/22/1965   53 y.o.   MRN: [id number] Visit Date: 04/30/2019  Today's Provider: Margaretann Loveless, PA-C   Chief Complaint  Patient presents with  . Dysuria    x 2 days   Subjective:     Dysuria  This is a new problem. Episode onset: 2 days ago. The problem has been unchanged. The quality of the pain is described as burning. There has been no fever. Associated symptoms include frequency (right side) and urgency. Pertinent negatives include no chills, hematuria, hesitancy, nausea or vomiting. Associated symptoms comments: Lower back pressure and pelvic pressure. She has tried nothing for the symptoms.    Patient also complains of difficulty sleeping. She reports that since March she has been working from home and has found it difficult to sleep at night. She reports it being difficult to stop work and she is unable to reset like she used to. She has tried OTC medications and they made her feel more wired. She has not tried melatonin. She has tried Trazodone in the past (2016) and it was ineffective.   Allergies  Allergen Reactions  . Amoxicillin Rash     Current Outpatient Medications:  .  amLODipine (NORVASC) 5 MG tablet, TAKE 1 TABLET BY MOUTH EVERY DAY, Disp: 90 tablet, Rfl: 3 .  buPROPion (WELLBUTRIN XL) 300 MG 24 hr tablet, TAKE 1 TABLET BY MOUTH EVERY DAY, Disp: 90 tablet, Rfl: 2 .  butalbital-aspirin-caffeine (FIORINAL) 50-325-40 MG capsule, TAKE 1 TO 2 CAPSULES BY MOUTH EVERY 8 HOURS AS NEEDED, Disp: 45 capsule, Rfl: 3 .  Calcium Carbonate-Vitamin D 600-200 MG-UNIT CAPS, Take 1 tablet by mouth 2 (two) times daily., Disp: , Rfl:  .  furosemide (LASIX) 20 MG tablet, TAKE 1 TABLET EVERY DAY AS NEEDED, Disp: 90 tablet, Rfl: 3 .  gabapentin (NEURONTIN) 100 MG capsule, TAKE 1 CAPSULE (100 MG TOTAL) BY MOUTH AT BEDTIME., Disp: 90 capsule, Rfl: 2 .  levothyroxine (SYNTHROID, LEVOTHROID) 137 MCG tablet, TAKE 1 TABLET BY MOUTH EVERY DAY,  Disp: 90 tablet, Rfl: 3 .  MULTIPLE VITAMINS-MINERALS PO, Take 1 tablet by mouth daily., Disp: , Rfl:  .  omeprazole (PRILOSEC) 20 MG capsule, TAKE 1 CAPSULE BY MOUTH EVERY DAY, Disp: 90 capsule, Rfl: 3  Review of Systems  Constitutional: Negative for appetite change, chills, fatigue and fever.  Respiratory: Negative for chest tightness and shortness of breath.   Cardiovascular: Negative for chest pain and palpitations.  Gastrointestinal: Positive for abdominal distention and abdominal pain. Negative for nausea and vomiting.  Genitourinary: Positive for dysuria, frequency (right side) and urgency. Negative for hematuria and hesitancy.  Musculoskeletal: Positive for back pain.  Neurological: Negative for dizziness and weakness.  Psychiatric/Behavioral: Positive for sleep disturbance.    Social History   Tobacco Use  . Smoking status: Never Smoker  . Smokeless tobacco: Never Used  Substance Use Topics  . Alcohol use: No    Alcohol/week: 0.0 standard drinks    Frequency: Never      Objective:   BP (!) 144/90 (BP Location: Right Arm)   Pulse 78   Temp (!) 97.3 F (36.3 C) (Temporal)   Resp 16   Wt 173 lb (78.5 kg)   SpO2 99% Comment: room air  BMI 30.65 kg/m  Vitals:   04/30/19 1527 04/30/19 1530  BP: (!) 138/92 (!) 144/90  Pulse: 78   Resp: 16   Temp: (!) 97.3 F (36.3 C)  TempSrc: Temporal   SpO2: 99%   Weight: 173 lb (78.5 kg)   Body mass index is 30.65 kg/m.   Physical Exam Vitals signs reviewed.  Constitutional:      General: She is not in acute distress.    Appearance: Normal appearance. She is well-developed. She is obese. She is not ill-appearing or diaphoretic.  Cardiovascular:     Rate and Rhythm: Normal rate and regular rhythm.     Heart sounds: Normal heart sounds. No murmur. No friction rub. No gallop.   Pulmonary:     Effort: Pulmonary effort is normal. No respiratory distress.     Breath sounds: Normal breath sounds. No wheezing or rales.   Abdominal:     General: Abdomen is flat. Bowel sounds are normal. There is no distension.     Palpations: Abdomen is soft. There is no mass.     Tenderness: There is abdominal tenderness in the suprapubic area. There is no right CVA tenderness, left CVA tenderness, guarding or rebound.  Skin:    General: Skin is warm and dry.  Neurological:     Mental Status: She is alert and oriented to person, place, and time.  Psychiatric:        Mood and Affect: Mood normal.        Behavior: Behavior normal.        Thought Content: Thought content normal.        Judgment: Judgment normal.      No results found for any visits on 04/30/19.     Assessment & Plan    1. Dysuria UA unremarkable.  - POCT Urinalysis Dipstick  2. Acute cystitis without hematuria Worsening symptoms. Will treat empirically with Bactrim as below since patient has h/o UTI and states this feels similar (no h/o pyelonephritis or renal stones). May continue AZO tabs for spasm. Continue to push fluids. Urine sent for culture. Will follow up pending C&S results. She is to call if symptoms do not improve or if they worsen.  - Urine Culture - sulfamethoxazole-trimethoprim (BACTRIM DS) 800-160 MG tablet; Take 1 tablet by mouth 2 (two) times daily.  Dispense: 10 tablet; Refill: 0  3. Primary insomnia Worsening. Has failed OTC sleep aids and trazodone. Will start Lunesta as below with titrating dose to 3mg  if needed. Call if not working or has side effects.  - eszopiclone (LUNESTA) 1 MG TABS tablet; Take 1 tablet (1 mg total) by mouth at bedtime as needed for sleep. May increase by 1mg  (1 tab) PO q hs weekly if needed to achieve sleep (titrating dose)  Dispense: 45 tablet; Refill: 0     Mar Daring, PA-C  [facility]

## 2019-04-30 NOTE — Patient Instructions (Signed)
Urinary Tract Infection, Adult A urinary tract infection (UTI) is an infection of any part of the urinary tract. The urinary tract includes:  The kidneys.  The ureters.  The bladder.  The urethra. These organs make, store, and get rid of pee (urine) in the body. What are the causes? This is caused by germs (bacteria) in your genital area. These germs grow and cause swelling (inflammation) of your urinary tract. What increases the risk? You are more likely to develop this condition if:  You have a small, thin tube (catheter) to drain pee.  You cannot control when you pee or poop (incontinence).  You are female, and: ? You use these methods to prevent pregnancy: ? A medicine that kills sperm (spermicide). ? A device that blocks sperm (diaphragm). ? You have low levels of a female hormone (estrogen). ? You are pregnant.  You have genes that add to your risk.  You are sexually active.  You take antibiotic medicines.  You have trouble peeing because of: ? A prostate that is bigger than normal, if you are female. ? A blockage in the part of your body that drains pee from the bladder (urethra). ? A kidney stone. ? A nerve condition that affects your bladder (neurogenic bladder). ? Not getting enough to drink. ? Not peeing often enough.  You have other conditions, such as: ? Diabetes. ? A weak disease-fighting system (immune system). ? Sickle cell disease. ? Gout. ? Injury of the spine. What are the signs or symptoms? Symptoms of this condition include:  Needing to pee right away (urgently).  Peeing often.  Peeing small amounts often.  Pain or burning when peeing.  Blood in the pee.  Pee that smells bad or not like normal.  Trouble peeing.  Pee that is cloudy.  Fluid coming from the vagina, if you are female.  Pain in the belly or lower back. Other symptoms include:  Throwing up (vomiting).  No urge to eat.  Feeling mixed up (confused).  Being tired  and grouchy (irritable).  A fever.  Watery poop (diarrhea). How is this treated? This condition may be treated with:  Antibiotic medicine.  Other medicines.  Drinking enough water. Follow these instructions at home:  Medicines  Take over-the-counter and prescription medicines only as told by your doctor.  If you were prescribed an antibiotic medicine, take it as told by your doctor. Do not stop taking it even if you start to feel better. General instructions  Make sure you: ? Pee until your bladder is empty. ? Do not hold pee for a long time. ? Empty your bladder after sex. ? Wipe from front to back after pooping if you are a female. Use each tissue one time when you wipe.  Drink enough fluid to keep your pee pale yellow.  Keep all follow-up visits as told by your doctor. This is important. Contact a doctor if:  You do not get better after 1-2 days.  Your symptoms go away and then come back. Get help right away if:  You have very bad back pain.  You have very bad pain in your lower belly.  You have a fever.  You are sick to your stomach (nauseous).  You are throwing up. Summary  A urinary tract infection (UTI) is an infection of any part of the urinary tract.  This condition is caused by germs in your genital area.  There are many risk factors for a UTI. These include having a small, thin   tube to drain pee and not being able to control when you pee or poop.  Treatment includes antibiotic medicines for germs.  Drink enough fluid to keep your pee pale yellow. This information is not intended to replace advice given to you by your health care provider. Make sure you discuss any questions you have with your health care provider. Document Released: 11/21/2007 Document Revised: 05/22/2018 Document Reviewed: 12/12/2017 Elsevier Patient Education  2020 Else[employer]. Eszopiclone tablets What is this medicine? ESZOPICLONE (es ZOE pi clone) is used to treat  insomnia. This medicine helps you to fall asleep and sleep through the night. This medicine may be used for other purposes; ask your health care provider or pharmacist if you have questions. COMMON BRAND NAME(S): Lunesta What should I tell my health care provider before I take this medicine? They need to know if you have any of these conditions:  depression  history of a drug or alcohol abuse problem  liver disease  lung or breathing disease  sleep-walking, driving, eating or other activity while not fully awake after taking a sleep medicine  suicidal thoughts  an unusual or allergic reaction to eszopiclone, other medicines, foods, dyes, or preservatives  pregnant or trying to get pregnant  breast-feeding How should I use this medicine? Take this medicine by mouth with a glass of water. Follow the directions on the prescription label. It is better to take this medicine on an empty stomach and only when you are ready for bed. Do not take your medicine more often than directed. If you have been taking this medicine for several weeks and suddenly stop taking it, you may get unpleasant withdrawal symptoms. Your doctor or health care professional may want to gradually reduce the dose. Do not stop taking this medicine on your own. Always follow your doctor or health care professional's advice. Talk to your pediatrician regarding the use of this medicine in children. Special care may be needed. Overdosage: If you think you have taken too much of this medicine contact a poison control center or emergency room at once. NOTE: This medicine is only for you. Do not share this medicine with others. What if I miss a dose? This does not apply. This medicine should only be taken immediately before going to sleep. Do not take double or extra doses. What may interact with this medicine?  herbal medicines like kava kava, melatonin, St. John's wort and valerian  lorazepam  medicines for fungal  infections like ketoconazole, fluconazole, or itraconazole  olanzapine This list may not describe all possible interactions. Give your health care provider a list of all the medicines, herbs, non-prescription drugs, or dietary supplements you use. Also tell them if you smoke, drink alcohol, or use illegal drugs. Some items may interact with your medicine. What should I watch for while using this medicine? Visit your doctor or health care professional for regular checks on your progress. Keep a regular sleep schedule by going to bed at about the same time nightly. Avoid caffeine-containing drinks in the evening hours, as caffeine can cause trouble with falling asleep. Talk to your doctor if you still have trouble sleeping. After taking this medicine, you may get up out of bed and do an activity that you do not know you are doing. The next morning, you may have no memory of this. Activities include driving a car ("sleep-driving"), making and eating food, talking on the phone, sexual activity, and sleep-walking. Serious injuries have occurred. Stop the medicine and call your  doctor right away if you find out you have done any of these activities. Do not take this medicine if you have used alcohol that evening. Do not take it if you have taken another medicine for sleep. The risk of doing these sleep-related activities is higher. Do not take this medicine unless you are able to stay in bed for a full night (7 to 8 hours) before you must be active again. You may have a decrease in mental alertness the day after use, even if you feel that you are fully awake. Tell your doctor if you will need to perform activities requiring full alertness, such as driving, the next day. Do not stand or sit up quickly after taking this medicine, especially if you are an older patient. This reduces the risk of dizzy or fainting spells. If you or your family notice any changes in your behavior, such as new or worsening depression,  thoughts of harming yourself, anxiety, other unusual or disturbing thoughts, or memory loss, call your doctor right away. After you stop taking this medicine, you may have trouble falling asleep. This is called rebound insomnia. This problem usually goes away on its own after 1 or 2 nights. What side effects may I notice from receiving this medicine? Side effects that you should report to your doctor or health care professional as soon as possible:  allergic reactions like skin rash, itching or hives, swelling of the face, lips, or tongue  changes in vision  confusion  depressed mood  feeling faint or lightheaded, falls  hallucinations  problems with balance, speaking, walking  restlessness, excitability, or feelings of agitation  unusual activities while not fully awake like driving, eating, making phone calls Side effects that usually do not require medical attention (report to your doctor or health care professional if they continue or are bothersome):  dizziness, or daytime drowsiness, sometimes called a hangover effect  headache This list may not describe all possible side effects. Call your doctor for medical advice about side effects. You may report side effects to FDA at 1-800-FDA-1088. Where should I keep my medicine? Keep out of the reach of children. This medicine can be abused. Keep your medicine in a safe place to protect it from theft. Do not share this medicine with anyone. Selling or giving away this medicine is dangerous and against the law. This medicine may cause accidental overdose and death if taken by other adults, children, or pets. Mix any unused medicine with a substance like cat litter or coffee grounds. Then throw the medicine away in a sealed container like a sealed bag or a coffee can with a lid. Do not use the medicine after the expiration date. Store at room temperature between 15 and 30 degrees C (59 and 86 degrees F). NOTE: This sheet is a summary. It  may not cover all possible information. If you have questions about this medicine, talk to your doctor, pharmacist, or health care provider.  2020 Elsevier/Gold Standard (2017-11-29 11:57:05)

## 2019-05-03 LAB — URINE CULTURE

## 2019-05-04 ENCOUNTER — Telehealth: Payer: Self-pay

## 2019-05-04 NOTE — Telephone Encounter (Signed)
-----   Message from Mar Daring, [place] sent at 05/04/2019  9:08 AM EST ----- Urine culture was negative for bacteria. If improving with antibiotic please continue until completed. If not improving may stop antibiotic and can evaluate for other causes such as kidney stone.

## 2019-05-04 NOTE — Telephone Encounter (Signed)
Tried calling; No answer.   Thanks,   -Laura  

## 2019-07-02 ENCOUNTER — Other Ambulatory Visit: Payer: Self-pay | Admitting: Family Medicine

## 2019-07-02 DIAGNOSIS — E039 Hypothyroidism, unspecified: Secondary | ICD-10-CM

## 2019-07-02 DIAGNOSIS — I10 Essential (primary) hypertension: Secondary | ICD-10-CM

## 2019-07-28 ENCOUNTER — Encounter: Payer: BLUE CROSS/BLUE SHIELD | Admitting: Family Medicine

## 2019-07-28 ENCOUNTER — Other Ambulatory Visit: Payer: Self-pay | Admitting: Family Medicine

## 2019-07-28 DIAGNOSIS — I10 Essential (primary) hypertension: Secondary | ICD-10-CM

## 2019-07-28 NOTE — Telephone Encounter (Signed)
Requested medications are due for refill today?  Yes  Requested medications are on active medication list?  Yes  Last Refill:   06/20/2018 Dispensed 90 with 3 refills  Future visit scheduled?  No  Notes to Clinic:   Patient was scheduled for physical exam today, but the appointment was cancelled.  Patient's last CPE was on 07/22/2018.  Please advise.

## 2019-08-17 DIAGNOSIS — Z01419 Encounter for gynecological examination (general) (routine) without abnormal findings: Secondary | ICD-10-CM | POA: Diagnosis not present

## 2019-08-17 DIAGNOSIS — Z1389 Encounter for screening for other disorder: Secondary | ICD-10-CM | POA: Diagnosis not present

## 2019-08-17 DIAGNOSIS — N926 Irregular menstruation, unspecified: Secondary | ICD-10-CM | POA: Diagnosis not present

## 2019-08-17 DIAGNOSIS — Z1231 Encounter for screening mammogram for malignant neoplasm of breast: Secondary | ICD-10-CM | POA: Diagnosis not present

## 2019-08-17 DIAGNOSIS — E669 Obesity, unspecified: Secondary | ICD-10-CM | POA: Diagnosis not present

## 2019-08-17 DIAGNOSIS — Z124 Encounter for screening for malignant neoplasm of cervix: Secondary | ICD-10-CM | POA: Diagnosis not present

## 2019-08-17 DIAGNOSIS — Z130 Encounter for screening for diseases of the blood and blood-forming organs and certain disorders involving the immune mechanism: Secondary | ICD-10-CM | POA: Diagnosis not present

## 2019-08-18 DIAGNOSIS — N923 Ovulation bleeding: Secondary | ICD-10-CM | POA: Diagnosis not present

## 2019-10-03 ENCOUNTER — Other Ambulatory Visit: Payer: Self-pay | Admitting: Family Medicine

## 2019-10-03 DIAGNOSIS — E039 Hypothyroidism, unspecified: Secondary | ICD-10-CM

## 2019-10-03 DIAGNOSIS — I10 Essential (primary) hypertension: Secondary | ICD-10-CM

## 2019-10-06 ENCOUNTER — Other Ambulatory Visit: Payer: Self-pay | Admitting: Family Medicine

## 2019-10-12 NOTE — Progress Notes (Signed)
Complete physical exam   Patient: Sheryl Tran   DOB: 1966/04/18   54 y.o. Female  MRN: [id number] Visit Date: 10/13/2019  Today's healthcare provider: [name], MD   Chief Complaint  Patient presents with  . Annual Exam   Subjective    Sheryl Tran is a 54 y.o. female who presents today for a complete physical exam.  She is a divorced white female, her 56 year old son is graduating  from [employer] into her daughter is now a Consulting civil engineer at [employer]. She reports consuming a general diet. Home exercise routine includes walking 2-3 hrs per day. She generally feels well. She reports sleeping fairly well. She does have additional problems to discuss today. She has had her first Covid vaccine. She is exercising some. HPI  Would like to discuss another medication other than Wellbutrin.   No past medical history on file. Past Surgical History:  Procedure Laterality Date  . BREAST LUMPECTOMY    . CHOLECYSTECTOMY  2006   Social History   Socioeconomic History  . Marital status: Divorced    Spouse name: Not on file  . Number of children: Not on file  . Years of education: Not on file  . Highest education level: Not on file  Occupational History  . Not on file  Tobacco Use  . Smoking status: Never Smoker  . Smokeless tobacco: Never Used  Substance and Sexual Activity  . Alcohol use: No    Alcohol/week: 0.0 standard drinks  . Drug use: No  . Sexual activity: Yes    Birth control/protection: Implant  Other Topics Concern  . Not on file  Social History Narrative  . Not on file   Social Determinants of Health   Financial Resource Strain:   . Difficulty of Paying Living Expenses:   Food Insecurity:   . Worried About Programme researcher, broadcasting/film/video in the Last Year:   . Barista in the Last Year:   Transportation Needs:   . Freight forwarder (Medical):   Marland Kitchen Lack of Transportation (Non-Medical):   Physical Activity:   . Days of Exercise per Week:   . Minutes of Exercise  per Session:   Stress:   . Feeling of Stress :   Social Connections:   . Frequency of Communication with Friends and Family:   . Frequency of Social Gatherings with Friends and Family:   . Attends Religious Services:   . Active Member of Clubs or Organizations:   . Attends Banker Meetings:   Marland Kitchen Marital Status:   Intimate Partner Violence:   . Fear of Current or Ex-Partner:   . Emotionally Abused:   Marland Kitchen Physically Abused:   . Sexually Abused:    Family Status  Relation Name Status  . Mother  Alive  . Father  Alive  . Sister  Alive  . MGM  (Not Specified)  . MGF  (Not Specified)  . PGM  (Not Specified)   Family History  Problem Relation Age of Onset  . Hypertension Mother   . Allergies Mother   . Hypothyroidism Mother   . Hypertension Father   . Coronary artery disease Father   . Cerebrovascular Disease Father   . ADD / ADHD Sister   . Lung cancer Maternal Grandmother   . Prostate cancer Maternal Grandfather   . CVA Paternal Grandmother    Allergies  Allergen Reactions  . Amoxicillin Rash    Patient Care Team: Julieanne Manson  Arma Heading., MD as PCP - General (Family Medicine)   Medications: Outpatient Medications Prior to Visit  Medication Sig  . amLODipine (NORVASC) 5 MG tablet TAKE 1 TABLET BY MOUTH EVERY DAY  . buPROPion (WELLBUTRIN XL) 300 MG 24 hr tablet TAKE 1 TABLET BY MOUTH EVERY DAY  . Calcium Carbonate-Vitamin D 600-200 MG-UNIT CAPS Take 1 tablet by mouth 2 (two) times daily.  . furosemide (LASIX) 20 MG tablet TAKE 1 TABLET BY MOUTH EVERY DAY AS NEEDED  . gabapentin (NEURONTIN) 100 MG capsule TAKE 1 CAPSULE (100 MG TOTAL) BY MOUTH AT BEDTIME.  Marland Kitchen levothyroxine (SYNTHROID) 137 MCG tablet TAKE 1 TABLET BY MOUTH EVERY DAY  . MULTIPLE VITAMINS-MINERALS PO Take 1 tablet by mouth daily.  Marland Kitchen omeprazole (PRILOSEC) 20 MG capsule TAKE 1 CAPSULE BY MOUTH EVERY DAY  . butalbital-aspirin-caffeine (FIORINAL) 50-325-40 MG capsule TAKE 1 TO 2 CAPSULES BY MOUTH  EVERY 8 HOURS AS NEEDED (Patient not taking: Reported on 10/13/2019)  . eszopiclone (LUNESTA) 1 MG TABS tablet Take 1 tablet (1 mg total) by mouth at bedtime as needed for sleep. May increase by 1mg  (1 tab) PO q hs weekly if needed to achieve sleep (titrating dose) (Patient not taking: Reported on 10/13/2019)  . sulfamethoxazole-trimethoprim (BACTRIM DS) 800-160 MG tablet Take 1 tablet by mouth 2 (two) times daily. (Patient not taking: Reported on 10/13/2019)   No facility-administered medications prior to visit.    Review of Systems  Constitutional: Positive for fatigue.  HENT: Negative.   Eyes: Negative.   Respiratory: Negative.   Cardiovascular: Negative.   Gastrointestinal: Negative.   Endocrine: Negative.   Genitourinary: Negative.   Musculoskeletal: Positive for arthralgias, back pain and myalgias.  Skin: Negative.   Allergic/Immunologic: Negative.   Neurological: Negative.   Hematological: Negative.   Psychiatric/Behavioral: Positive for agitation and sleep disturbance. The patient is nervous/anxious.        Objective    BP 130/80 (BP Location: Right Arm, Patient Position: Sitting, Cuff Size: Large)   Pulse 76   Temp (!) 97.1 F (36.2 C) (Temporal)   Ht 5\' 3"  (1.6 m)   Wt 172 lb 3.2 oz (78.1 kg)   LMP 10/12/2019 (Exact Date)   SpO2 100%   BMI 30.50 kg/m  BP Readings from Last 3 Encounters:  10/13/19 130/80  04/30/19 (!) 144/90  07/22/18 122/82   Wt Readings from Last 3 Encounters:  10/13/19 172 lb 3.2 oz (78.1 kg)  04/30/19 173 lb (78.5 kg)  07/22/18 171 lb 9.6 oz (77.8 kg)      Physical Exam Vitals reviewed.  Constitutional:      Appearance: Normal appearance. She is well-developed.  HENT:     Head: Normocephalic and atraumatic.     Right Ear: External ear normal.     Left Ear: External ear normal.     Nose: Nose normal.     Mouth/Throat:     Pharynx: Oropharynx is clear.  Eyes:     General: No scleral icterus.    Conjunctiva/sclera: Conjunctivae  normal.  Neck:     Thyroid: No thyromegaly.  Cardiovascular:     Rate and Rhythm: Normal rate and regular rhythm.     Heart sounds: Normal heart sounds.  Pulmonary:     Effort: Pulmonary effort is normal.     Breath sounds: Normal breath sounds.  Abdominal:     Palpations: Abdomen is soft.  Genitourinary:    Comments: Irregular menses. Musculoskeletal:     Right lower leg: No edema.  Left lower leg: No edema.  Lymphadenopathy:     Cervical: No cervical adenopathy.  Skin:    General: Skin is warm and dry.  Neurological:     General: No focal deficit present.     Mental Status: She is alert and oriented to person, place, and time. Mental status is at baseline.  Psychiatric:        Mood and Affect: Mood normal.        Behavior: Behavior normal.        Thought Content: Thought content normal.        Judgment: Judgment normal.       Depression Screen  PHQ 2/9 Scores 10/13/2019 07/11/2017 06/25/2017  PHQ - 2 Score 1 1 0  PHQ- 9 Score 5 3 3    GAD 7 : Generalized Anxiety Score 10/13/2019  Nervous, Anxious, on Edge 2  Control/stop worrying 1  Worry too much - different things 1  Trouble relaxing 1  Restless 1  Easily annoyed or irritable 1  Afraid - awful might happen 1  Total GAD 7 Score 8  Anxiety Difficulty Somewhat difficult   Results for orders placed or performed in visit on 10/13/19  POCT urinalysis dipstick  Result Value Ref Range   Color, UA     Clarity, UA     Glucose, UA Negative Negative   Bilirubin, UA negative    Ketones, UA negative    Spec Grav, UA <=1.005 (A) 1.010 - 1.025   Blood, UA large    pH, UA 6.5 5.0 - 8.0   Protein, UA Negative Negative   Urobilinogen, UA 0.2 0.2 or 1.0 E.U./dL   Nitrite, UA negative    Leukocytes, UA Negative Negative   Appearance     Odor      Assessment & Plan    Routine Health Maintenance and Physical Exam  Exercise Activities and Dietary recommendations Goals   None     Immunization History  Administered  Date(s) Administered  . Influenza Whole 04/18/2017  . Influenza,inj,Quad PF,6+ Mos 03/12/2015, 03/05/2016, 04/05/2017, 03/03/2018  . Tdap 10/07/2008    Health Maintenance  Topic Date Due  . COVID-19 Vaccine (1) Never done  . TETANUS/TDAP  10/08/2018  . MAMMOGRAM  05/31/2019  . INFLUENZA VACCINE  01/17/2020  . PAP SMEAR-Modifier  05/30/2020  . COLONOSCOPY  09/17/2027  . HIV Screening  Completed    Discussed health benefits of physical activity, and encouraged her to engage in regular exercise appropriate for her age and condition.  1. Annual physical exam Well woman exam per Dr. 11/17/2027.  2. Anxiety After long discussion with patient will start sertraline 50 mg daily. If she is doing well not on follow-up then we will cut back on her Wellbutrin with the idea of getting off of it. - CBC with Differential/Platelet - Comprehensive metabolic panel - TSH - Lipid panel - POCT urinalysis dipstick - DG Lumbar Spine Complete - sertraline (ZOLOFT) 50 MG tablet; Take 1 tablet (50 mg total) by mouth daily.  Dispense: 30 tablet; Refill: 2  3. Adult hypothyroidism  - CBC with Differential/Platelet - Comprehensive metabolic panel - TSH - Lipid panel - POCT urinalysis dipstick - DG Lumbar Spine Complete  4. Hypertension, unspecified type Controlled on amlodipine - CBC with Differential/Platelet - Comprehensive metabolic panel - TSH - Lipid panel - POCT urinalysis dipstick - DG Lumbar Spine Complete  5. Chronic bilateral low back pain without sciatica This is starting to bother her sleep. Will obtain baseline x-ray. I  think this is musculoskeletal pain at this time.   No follow-ups on file.        Nataline Basara Cranford Mon, MD  [facility] [phone] (phone) [phone] (fax)  [place]

## 2019-10-13 ENCOUNTER — Encounter: Payer: Self-pay | Admitting: Family Medicine

## 2019-10-13 ENCOUNTER — Ambulatory Visit
Admission: RE | Admit: 2019-10-13 | Discharge: 2019-10-13 | Disposition: A | Discharge status: Home / Self Care | Payer: BC Managed Care – PPO | Source: Ambulatory Visit | Attending: Family Medicine | Admitting: Family Medicine

## 2019-10-13 ENCOUNTER — Ambulatory Visit (INDEPENDENT_AMBULATORY_CARE_PROVIDER_SITE_OTHER): Payer: BC Managed Care – PPO | Admitting: Family Medicine

## 2019-10-13 ENCOUNTER — Other Ambulatory Visit: Payer: Self-pay

## 2019-10-13 VITALS — BP 130/80 | HR 76 | Temp 97.1°F | Ht 63.0 in | Wt 172.2 lb

## 2019-10-13 DIAGNOSIS — G8929 Other chronic pain: Secondary | ICD-10-CM

## 2019-10-13 DIAGNOSIS — F419 Anxiety disorder, unspecified: Secondary | ICD-10-CM | POA: Diagnosis not present

## 2019-10-13 DIAGNOSIS — Z0000 Encounter for general adult medical examination without abnormal findings: Secondary | ICD-10-CM

## 2019-10-13 DIAGNOSIS — M545 Low back pain, unspecified: Secondary | ICD-10-CM

## 2019-10-13 DIAGNOSIS — E039 Hypothyroidism, unspecified: Secondary | ICD-10-CM | POA: Diagnosis not present

## 2019-10-13 DIAGNOSIS — I10 Essential (primary) hypertension: Secondary | ICD-10-CM | POA: Insufficient documentation

## 2019-10-13 LAB — POCT URINALYSIS DIPSTICK
Bilirubin, UA: NEGATIVE
Glucose, UA: NEGATIVE
Ketones, UA: NEGATIVE
Leukocytes, UA: NEGATIVE
Nitrite, UA: NEGATIVE
Protein, UA: NEGATIVE
Spec Grav, UA: <=1.005 — AB (ref 1.010–1.025)
Urobilinogen, UA: 0.2 E.U./dL
pH, UA: 6.5 (ref 5.0–8.0)

## 2019-10-13 MED ORDER — SERTRALINE HCL 50 MG PO TABS: 50.0000 mg | ORAL_TABLET | Freq: Every day | ORAL | 2 refills | Status: DC

## 2019-10-15 DIAGNOSIS — E039 Hypothyroidism, unspecified: Secondary | ICD-10-CM | POA: Diagnosis not present

## 2019-10-15 DIAGNOSIS — F419 Anxiety disorder, unspecified: Secondary | ICD-10-CM | POA: Diagnosis not present

## 2019-10-15 DIAGNOSIS — I10 Essential (primary) hypertension: Secondary | ICD-10-CM | POA: Diagnosis not present

## 2019-10-16 ENCOUNTER — Telehealth: Payer: Self-pay | Admitting: *Deleted

## 2019-10-16 LAB — CBC WITH DIFFERENTIAL/PLATELET
Basophils Absolute: 0 10*3/uL (ref 0.0–0.2)
Basos: 1 %
EOS (ABSOLUTE): 0.1 10*3/uL (ref 0.0–0.4)
Eos: 2 %
Hematocrit: 39.8 % (ref 34.0–46.6)
Hemoglobin: 13 g/dL (ref 11.1–15.9)
Immature Grans (Abs): 0 10*3/uL (ref 0.0–0.1)
Immature Granulocytes: 0 %
Lymphocytes Absolute: 1.4 10*3/uL (ref 0.7–3.1)
Lymphs: 26 %
MCH: 27.9 pg (ref 26.6–33.0)
MCHC: 32.7 g/dL (ref 31.5–35.7)
MCV: 85 fL (ref 79–97)
Monocytes Absolute: 0.4 10*3/uL (ref 0.1–0.9)
Monocytes: 8 %
Neutrophils Absolute: 3.5 10*3/uL (ref 1.4–7.0)
Neutrophils: 63 %
Platelets: 293 10*3/uL (ref 150–450)
RBC: 4.66 x10E6/uL (ref 3.77–5.28)
RDW: 13.9 % (ref 11.7–15.4)
WBC: 5.4 10*3/uL (ref 3.4–10.8)

## 2019-10-16 LAB — COMPREHENSIVE METABOLIC PANEL
ALT: 13 IU/L (ref 0–32)
AST: 18 IU/L (ref 0–40)
Albumin/Globulin Ratio: 1.9 (ref 1.2–2.2)
Albumin: 4.5 g/dL (ref 3.8–4.9)
Alkaline Phosphatase: 87 IU/L (ref 39–117)
BUN/Creatinine Ratio: 13 (ref 9–23)
BUN: 9 mg/dL (ref 6–24)
Bilirubin Total: 0.2 mg/dL (ref 0.0–1.2)
CO2: 20 mmol/L (ref 20–29)
Calcium: 9.3 mg/dL (ref 8.7–10.2)
Chloride: 102 mmol/L (ref 96–106)
Creatinine, Ser: 0.71 mg/dL (ref 0.57–1.00)
GFR calc Af Amer: 113 mL/min/{1.73_m2} (ref >59)
GFR calc non Af Amer: 98 mL/min/{1.73_m2} (ref >59)
Globulin, Total: 2.4 g/dL (ref 1.5–4.5)
Glucose: 90 mg/dL (ref 65–99)
Potassium: 3.8 mmol/L (ref 3.5–5.2)
Sodium: 138 mmol/L (ref 134–144)
Total Protein: 6.9 g/dL (ref 6.0–8.5)

## 2019-10-16 LAB — LIPID PANEL
Chol/HDL Ratio: 3.6 ratio (ref 0.0–4.4)
Cholesterol, Total: 197 mg/dL (ref 100–199)
HDL: 55 mg/dL (ref >39)
LDL Chol Calc (NIH): 113 mg/dL — ABNORMAL HIGH (ref 0–99)
Triglycerides: 166 mg/dL — ABNORMAL HIGH (ref 0–149)
VLDL Cholesterol Cal: 29 mg/dL (ref 5–40)

## 2019-10-16 LAB — TSH: TSH: 3.65 u[IU]/mL (ref 0.450–4.500)

## 2019-10-16 NOTE — Telephone Encounter (Signed)
-----   Message from Sheryl Tran., MD sent at 10/16/2019  8:36 AM EDT ----- Labs stable.  Work on diet and exercise as discussed to help lower mildly elevated triglycerides

## 2019-10-16 NOTE — Telephone Encounter (Addendum)
LMOVM for pt to return call. Okay for [facility] to give pt results.

## 2019-10-16 NOTE — Telephone Encounter (Signed)
Pt. Called back, given results. Verbalizes understanding. Request a copy of labs be mailed to her home address. She is also waiting for results of her lumbar spine xray.

## 2019-10-19 NOTE — Telephone Encounter (Signed)
Patient has been advised. KW 

## 2019-10-19 NOTE — Telephone Encounter (Signed)
Please advise xray  

## 2019-10-19 NOTE — Telephone Encounter (Signed)
Back x-ray normal.

## 2019-10-22 DIAGNOSIS — M9902 Segmental and somatic dysfunction of thoracic region: Secondary | ICD-10-CM | POA: Diagnosis not present

## 2019-10-22 DIAGNOSIS — R519 Headache, unspecified: Secondary | ICD-10-CM | POA: Diagnosis not present

## 2019-10-22 DIAGNOSIS — M5412 Radiculopathy, cervical region: Secondary | ICD-10-CM | POA: Diagnosis not present

## 2019-10-22 DIAGNOSIS — M9901 Segmental and somatic dysfunction of cervical region: Secondary | ICD-10-CM | POA: Diagnosis not present

## 2019-10-26 DIAGNOSIS — M9901 Segmental and somatic dysfunction of cervical region: Secondary | ICD-10-CM | POA: Diagnosis not present

## 2019-10-26 DIAGNOSIS — R519 Headache, unspecified: Secondary | ICD-10-CM | POA: Diagnosis not present

## 2019-10-26 DIAGNOSIS — M5412 Radiculopathy, cervical region: Secondary | ICD-10-CM | POA: Diagnosis not present

## 2019-10-26 DIAGNOSIS — M9902 Segmental and somatic dysfunction of thoracic region: Secondary | ICD-10-CM | POA: Diagnosis not present

## 2019-10-29 DIAGNOSIS — M9901 Segmental and somatic dysfunction of cervical region: Secondary | ICD-10-CM | POA: Diagnosis not present

## 2019-10-29 DIAGNOSIS — M5412 Radiculopathy, cervical region: Secondary | ICD-10-CM | POA: Diagnosis not present

## 2019-10-29 DIAGNOSIS — M9902 Segmental and somatic dysfunction of thoracic region: Secondary | ICD-10-CM | POA: Diagnosis not present

## 2019-10-29 DIAGNOSIS — R519 Headache, unspecified: Secondary | ICD-10-CM | POA: Diagnosis not present

## 2019-11-02 DIAGNOSIS — R519 Headache, unspecified: Secondary | ICD-10-CM | POA: Diagnosis not present

## 2019-11-02 DIAGNOSIS — M5412 Radiculopathy, cervical region: Secondary | ICD-10-CM | POA: Diagnosis not present

## 2019-11-02 DIAGNOSIS — M9902 Segmental and somatic dysfunction of thoracic region: Secondary | ICD-10-CM | POA: Diagnosis not present

## 2019-11-02 DIAGNOSIS — M9901 Segmental and somatic dysfunction of cervical region: Secondary | ICD-10-CM | POA: Diagnosis not present

## 2019-11-04 ENCOUNTER — Other Ambulatory Visit: Payer: Self-pay | Admitting: Family Medicine

## 2019-11-04 DIAGNOSIS — F419 Anxiety disorder, unspecified: Secondary | ICD-10-CM

## 2019-11-04 NOTE — Telephone Encounter (Signed)
   Notes to clinic:  Patient requesting 90 day supply with one refill instead of 30 day with 2 refills   Requested Prescriptions  Pending Prescriptions Disp Refills   sertraline (ZOLOFT) 50 MG tablet [Pharmacy Med Name: SERTRALINE HCL 50 MG TABLET] 90 tablet 1    Sig: TAKE 1 TABLET BY MOUTH EVERY DAY      Psychiatry:  Antidepressants - SSRI Failed - 11/04/2019  9:30 AM      Failed - Valid encounter within last 6 months    Recent Outpatient Visits           3 weeks ago Anxiety   [facility] Maple Hudson., MD   6 months ago Dysuria   [facility] [place], [place], [place]   1 year ago Encounter for annual physical exam   [facility] Maple Hudson., MD   1 year ago Lymphadenitis   [facility] Maple Hudson., MD   1 year ago Elevated triglycerides with high cholesterol   [facility] Maple Hudson., MD       Future Appointments             In 1 month Maple Hudson., MD [facility], [facility]            Passed - Completed PHQ-2 or PHQ-9 in the last 360 days.

## 2019-11-05 DIAGNOSIS — M9902 Segmental and somatic dysfunction of thoracic region: Secondary | ICD-10-CM | POA: Diagnosis not present

## 2019-11-05 DIAGNOSIS — M9901 Segmental and somatic dysfunction of cervical region: Secondary | ICD-10-CM | POA: Diagnosis not present

## 2019-11-05 DIAGNOSIS — R519 Headache, unspecified: Secondary | ICD-10-CM | POA: Diagnosis not present

## 2019-11-05 DIAGNOSIS — M5412 Radiculopathy, cervical region: Secondary | ICD-10-CM | POA: Diagnosis not present

## 2019-11-09 DIAGNOSIS — M9902 Segmental and somatic dysfunction of thoracic region: Secondary | ICD-10-CM | POA: Diagnosis not present

## 2019-11-09 DIAGNOSIS — M5412 Radiculopathy, cervical region: Secondary | ICD-10-CM | POA: Diagnosis not present

## 2019-11-09 DIAGNOSIS — R519 Headache, unspecified: Secondary | ICD-10-CM | POA: Diagnosis not present

## 2019-11-09 DIAGNOSIS — M9901 Segmental and somatic dysfunction of cervical region: Secondary | ICD-10-CM | POA: Diagnosis not present

## 2019-11-12 DIAGNOSIS — M5412 Radiculopathy, cervical region: Secondary | ICD-10-CM | POA: Diagnosis not present

## 2019-11-12 DIAGNOSIS — M9901 Segmental and somatic dysfunction of cervical region: Secondary | ICD-10-CM | POA: Diagnosis not present

## 2019-11-12 DIAGNOSIS — M9902 Segmental and somatic dysfunction of thoracic region: Secondary | ICD-10-CM | POA: Diagnosis not present

## 2019-11-12 DIAGNOSIS — R519 Headache, unspecified: Secondary | ICD-10-CM | POA: Diagnosis not present

## 2019-11-17 DIAGNOSIS — R519 Headache, unspecified: Secondary | ICD-10-CM | POA: Diagnosis not present

## 2019-11-17 DIAGNOSIS — M5412 Radiculopathy, cervical region: Secondary | ICD-10-CM | POA: Diagnosis not present

## 2019-11-17 DIAGNOSIS — M9902 Segmental and somatic dysfunction of thoracic region: Secondary | ICD-10-CM | POA: Diagnosis not present

## 2019-11-17 DIAGNOSIS — M9901 Segmental and somatic dysfunction of cervical region: Secondary | ICD-10-CM | POA: Diagnosis not present

## 2019-11-19 DIAGNOSIS — M9902 Segmental and somatic dysfunction of thoracic region: Secondary | ICD-10-CM | POA: Diagnosis not present

## 2019-11-19 DIAGNOSIS — R519 Headache, unspecified: Secondary | ICD-10-CM | POA: Diagnosis not present

## 2019-11-19 DIAGNOSIS — M9901 Segmental and somatic dysfunction of cervical region: Secondary | ICD-10-CM | POA: Diagnosis not present

## 2019-11-19 DIAGNOSIS — M5412 Radiculopathy, cervical region: Secondary | ICD-10-CM | POA: Diagnosis not present

## 2019-11-23 DIAGNOSIS — M9901 Segmental and somatic dysfunction of cervical region: Secondary | ICD-10-CM | POA: Diagnosis not present

## 2019-11-23 DIAGNOSIS — M9902 Segmental and somatic dysfunction of thoracic region: Secondary | ICD-10-CM | POA: Diagnosis not present

## 2019-11-23 DIAGNOSIS — R519 Headache, unspecified: Secondary | ICD-10-CM | POA: Diagnosis not present

## 2019-11-23 DIAGNOSIS — M5412 Radiculopathy, cervical region: Secondary | ICD-10-CM | POA: Diagnosis not present

## 2019-12-06 ENCOUNTER — Other Ambulatory Visit: Payer: Self-pay | Admitting: Family Medicine

## 2019-12-06 DIAGNOSIS — G43809 Other migraine, not intractable, without status migrainosus: Secondary | ICD-10-CM

## 2019-12-06 DIAGNOSIS — N951 Menopausal and female climacteric states: Secondary | ICD-10-CM

## 2019-12-06 NOTE — Telephone Encounter (Signed)
Requested Prescriptions  Pending Prescriptions Disp Refills  . gabapentin (NEURONTIN) 100 MG capsule [Pharmacy Med Name: GABAPENTIN 100 MG CAPSULE] 90 capsule 2    Sig: TAKE 1 CAPSULE (100 MG TOTAL) BY MOUTH AT BEDTIME.     Neurology: Anticonvulsants - gabapentin Passed - 12/06/2019 12:52 AM      Passed - Valid encounter within last 12 months    Recent Outpatient Visits          1 month ago Anxiety   [facility] Maple Hudson., MD   7 months ago Dysuria   [facility] [place], [place], [place]   1 year ago Encounter for annual physical exam   [facility] Maple Hudson., MD   1 year ago Lymphadenitis   [facility] Maple Hudson., MD   2 years ago Elevated triglycerides with high cholesterol   [facility] Maple Hudson., MD      Future Appointments            In 1 week Maple Hudson., MD [facility], [facility]

## 2019-12-16 ENCOUNTER — Ambulatory Visit: Payer: Self-pay | Admitting: Family Medicine

## 2020-05-02 ENCOUNTER — Other Ambulatory Visit: Payer: Self-pay | Admitting: Family Medicine

## 2020-05-02 DIAGNOSIS — I10 Essential (primary) hypertension: Secondary | ICD-10-CM

## 2020-05-02 NOTE — Telephone Encounter (Signed)
Requested Prescriptions  Pending Prescriptions Disp Refills   amLODipine (NORVASC) 5 MG tablet [Pharmacy Med Name: AMLODIPINE BESYLATE 5 MG TAB] 30 tablet 0    Sig: TAKE 1 TABLET BY MOUTH EVERY DAY     Cardiovascular:  Calcium Channel Blockers Failed - 05/02/2020  1:37 PM      Failed - Valid encounter within last 6 months    Recent Outpatient Visits          6 months ago Anxiety   [facility] Maple Hudson., MD   1 year ago Dysuria   [facility] [place], [place], [place]   1 year ago Encounter for annual physical exam   [facility] Maple Hudson., MD   1 year ago Lymphadenitis   [facility] Maple Hudson., MD   2 years ago Elevated triglycerides with high cholesterol   [facility] Maple Hudson., MD             Passed - Last BP in normal range    BP Readings from Last 1 Encounters:  10/13/19 130/80         Called left VM to return call for scheduling appointment. Gave 30 day courtesy refill.

## 2020-05-20 ENCOUNTER — Other Ambulatory Visit: Payer: Self-pay | Admitting: Family Medicine

## 2020-05-20 DIAGNOSIS — F419 Anxiety disorder, unspecified: Secondary | ICD-10-CM

## 2020-05-20 NOTE — Telephone Encounter (Signed)
Requested medications are due for refill today yes  Requested medications are on the active medication list yes  Last refill 8/28  Last visit 09/2019  Future visit scheduled no  Notes to clinic Failed protocol due to no valid visit within 6  months, no upcoming appt scheduled.

## 2020-05-24 ENCOUNTER — Other Ambulatory Visit: Payer: Self-pay | Admitting: Family Medicine

## 2020-05-24 DIAGNOSIS — I10 Essential (primary) hypertension: Secondary | ICD-10-CM

## 2020-05-31 ENCOUNTER — Other Ambulatory Visit: Payer: Self-pay | Admitting: Family Medicine

## 2020-05-31 DIAGNOSIS — I10 Essential (primary) hypertension: Secondary | ICD-10-CM

## 2020-05-31 MED ORDER — AMLODIPINE BESYLATE 5 MG PO TABS: 5.0000 mg | ORAL_TABLET | Freq: Every day | ORAL | 4 refills | Status: DC

## 2020-05-31 NOTE — Telephone Encounter (Signed)
Patient requesting amLODipine (NORVASC) 5 MG tablet to hold her over until 09/19/2020 (which is PCP first available) informed please allow 48 to 72 hour turn around time   [id number] [place], [place] - [address] Phone:  [phone]  Fax:  [phone]

## 2020-06-15 DIAGNOSIS — H0014 Chalazion left upper eyelid: Secondary | ICD-10-CM | POA: Diagnosis not present

## 2020-06-26 DIAGNOSIS — Z1152 Encounter for screening for COVID-19: Secondary | ICD-10-CM | POA: Diagnosis not present

## 2020-08-12 ENCOUNTER — Other Ambulatory Visit: Payer: Self-pay | Admitting: Family Medicine

## 2020-08-12 DIAGNOSIS — I10 Essential (primary) hypertension: Secondary | ICD-10-CM

## 2020-08-17 DIAGNOSIS — Z1231 Encounter for screening mammogram for malignant neoplasm of breast: Secondary | ICD-10-CM | POA: Diagnosis not present

## 2020-08-17 DIAGNOSIS — E669 Obesity, unspecified: Secondary | ICD-10-CM | POA: Diagnosis not present

## 2020-08-17 DIAGNOSIS — Z130 Encounter for screening for diseases of the blood and blood-forming organs and certain disorders involving the immune mechanism: Secondary | ICD-10-CM | POA: Diagnosis not present

## 2020-08-17 DIAGNOSIS — Z1389 Encounter for screening for other disorder: Secondary | ICD-10-CM | POA: Diagnosis not present

## 2020-08-17 DIAGNOSIS — Z01419 Encounter for gynecological examination (general) (routine) without abnormal findings: Secondary | ICD-10-CM | POA: Diagnosis not present

## 2020-08-29 ENCOUNTER — Other Ambulatory Visit: Payer: Self-pay | Admitting: Family Medicine

## 2020-08-29 DIAGNOSIS — I10 Essential (primary) hypertension: Secondary | ICD-10-CM

## 2020-09-19 ENCOUNTER — Encounter: Payer: Self-pay | Admitting: Family Medicine

## 2020-09-27 ENCOUNTER — Other Ambulatory Visit: Payer: Self-pay | Admitting: Family Medicine

## 2020-09-27 DIAGNOSIS — I10 Essential (primary) hypertension: Secondary | ICD-10-CM

## 2020-09-27 NOTE — Telephone Encounter (Signed)
  Notes to clinic: Patient already given courtesy refill Patient was scheduled for 09/19/2020 and now is schedule for 12/13/2020 Review for refill until that time   Requested Prescriptions  Pending Prescriptions Disp Refills   amLODipine (NORVASC) 5 MG tablet [Pharmacy Med Name: AMLODIPINE BESYLATE 5 MG TAB] 30 tablet 0    Sig: TAKE 1 TABLET (5 MG TOTAL) BY MOUTH DAILY.      Cardiovascular:  Calcium Channel Blockers Failed - 09/27/2020  9:30 AM      Failed - Valid encounter within last 6 months    Recent Outpatient Visits           11 months ago Anxiety   [facility] Maple Hudson., MD   1 year ago Dysuria   [facility] [place], [place], [place]   2 years ago Encounter for annual physical exam   [facility] Maple Hudson., MD   2 years ago Lymphadenitis   [facility] Maple Hudson., MD   2 years ago Elevated triglycerides with high cholesterol   [facility] Maple Hudson., MD       Future Appointments             In 2 months Maple Hudson., MD [facility], [facility]             Passed - Last BP in normal range    BP Readings from Last 1 Encounters:  10/13/19 130/80

## 2020-09-27 NOTE — Telephone Encounter (Signed)
Please review below. Thanks!  

## 2020-10-25 ENCOUNTER — Other Ambulatory Visit: Payer: Self-pay | Admitting: Family Medicine

## 2020-10-25 DIAGNOSIS — E039 Hypothyroidism, unspecified: Secondary | ICD-10-CM

## 2020-10-27 ENCOUNTER — Other Ambulatory Visit: Payer: Self-pay | Admitting: Family Medicine

## 2020-10-27 DIAGNOSIS — I10 Essential (primary) hypertension: Secondary | ICD-10-CM

## 2020-11-03 ENCOUNTER — Other Ambulatory Visit: Payer: Self-pay | Admitting: Family Medicine

## 2020-11-03 DIAGNOSIS — N951 Menopausal and female climacteric states: Secondary | ICD-10-CM

## 2020-11-03 DIAGNOSIS — G43809 Other migraine, not intractable, without status migrainosus: Secondary | ICD-10-CM

## 2020-11-07 ENCOUNTER — Other Ambulatory Visit: Payer: Self-pay | Admitting: Family Medicine

## 2020-11-07 DIAGNOSIS — I10 Essential (primary) hypertension: Secondary | ICD-10-CM

## 2020-11-19 ENCOUNTER — Other Ambulatory Visit: Payer: Self-pay | Admitting: Family Medicine

## 2020-11-19 DIAGNOSIS — I10 Essential (primary) hypertension: Secondary | ICD-10-CM

## 2020-11-19 NOTE — Telephone Encounter (Signed)
Requested Prescriptions  Pending Prescriptions Disp Refills  . amLODipine (NORVASC) 5 MG tablet [Pharmacy Med Name: AMLODIPINE BESYLATE 5 MG TAB] 24 tablet 0    Sig: TAKE 1 TABLET (5 MG TOTAL) BY MOUTH DAILY.     Cardiovascular:  Calcium Channel Blockers Failed - 11/19/2020 10:30 AM      Failed - Valid encounter within last 6 months    Recent Outpatient Visits          1 year ago Anxiety   [facility] Maple Hudson., MD   1 year ago Dysuria   [facility] [place], [place], [place]   2 years ago Encounter for annual physical exam   [facility] Maple Hudson., MD   2 years ago Lymphadenitis   [facility] Maple Hudson., MD   3 years ago Elevated triglycerides with high cholesterol   [facility] Maple Hudson., MD      Future Appointments            In 3 weeks Maple Hudson., MD [facility], [facility]           Passed - Last BP in normal range    BP Readings from Last 1 Encounters:  10/13/19 130/80

## 2020-12-06 DIAGNOSIS — M9902 Segmental and somatic dysfunction of thoracic region: Secondary | ICD-10-CM | POA: Diagnosis not present

## 2020-12-06 DIAGNOSIS — M9901 Segmental and somatic dysfunction of cervical region: Secondary | ICD-10-CM | POA: Diagnosis not present

## 2020-12-06 DIAGNOSIS — M5412 Radiculopathy, cervical region: Secondary | ICD-10-CM | POA: Diagnosis not present

## 2020-12-06 DIAGNOSIS — M62830 Muscle spasm of back: Secondary | ICD-10-CM | POA: Diagnosis not present

## 2020-12-13 ENCOUNTER — Encounter: Payer: Self-pay | Admitting: Family Medicine

## 2020-12-13 DIAGNOSIS — M62830 Muscle spasm of back: Secondary | ICD-10-CM | POA: Diagnosis not present

## 2020-12-13 DIAGNOSIS — M9901 Segmental and somatic dysfunction of cervical region: Secondary | ICD-10-CM | POA: Diagnosis not present

## 2020-12-13 DIAGNOSIS — M5412 Radiculopathy, cervical region: Secondary | ICD-10-CM | POA: Diagnosis not present

## 2020-12-13 DIAGNOSIS — M9902 Segmental and somatic dysfunction of thoracic region: Secondary | ICD-10-CM | POA: Diagnosis not present

## 2020-12-24 ENCOUNTER — Other Ambulatory Visit: Payer: Self-pay | Admitting: Family Medicine

## 2020-12-24 DIAGNOSIS — I10 Essential (primary) hypertension: Secondary | ICD-10-CM

## 2020-12-25 IMAGING — US ULTRASOUND LEFT BREAST LIMITED
1 series · 10 of 10 positions shown · non-contrast
Comparison: Previous exam(s).

CLINICAL DATA: Patient was called for a possible left breast
asymmetry.

EXAM:
DIGITAL DIAGNOSTIC LEFT MAMMOGRAM WITH TOMO
ULTRASOUND LEFT BREAST

[Series 1: ultrasound left breast limited · 0.08mm/px · 10 of 10 slices shown]
[im 1/10]
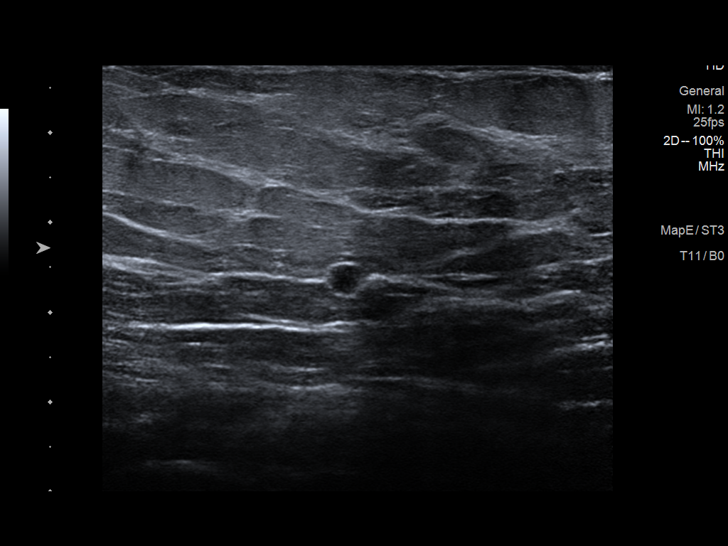
[im 2/10]
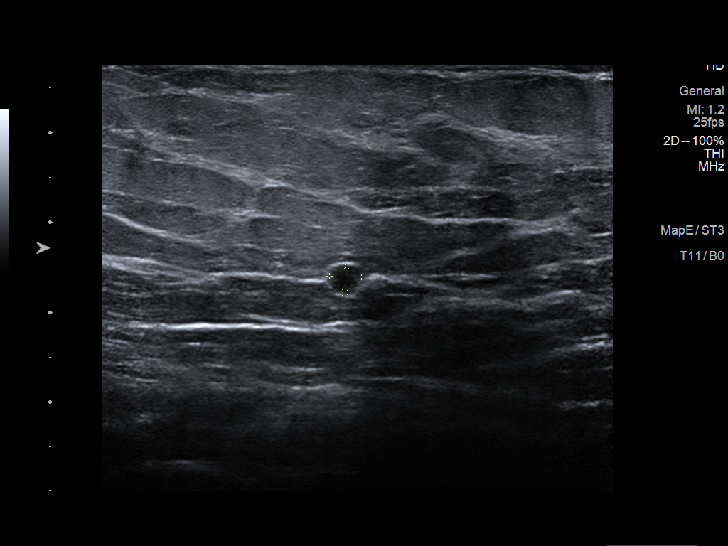
[im 3/10]
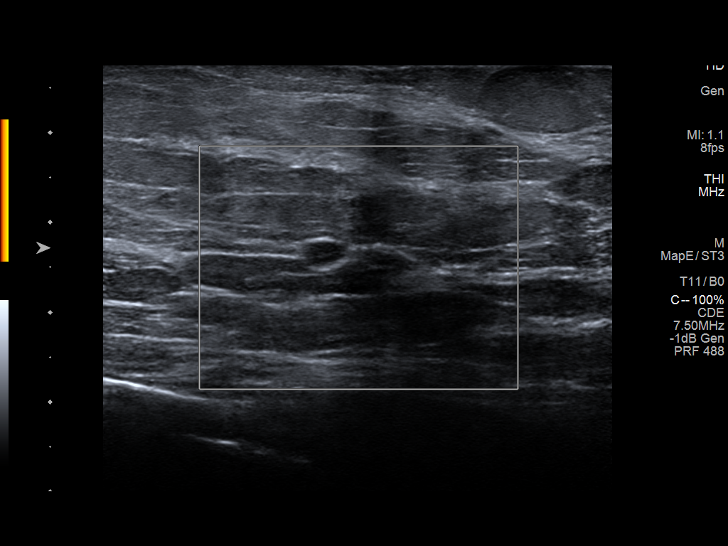
[im 4/10]
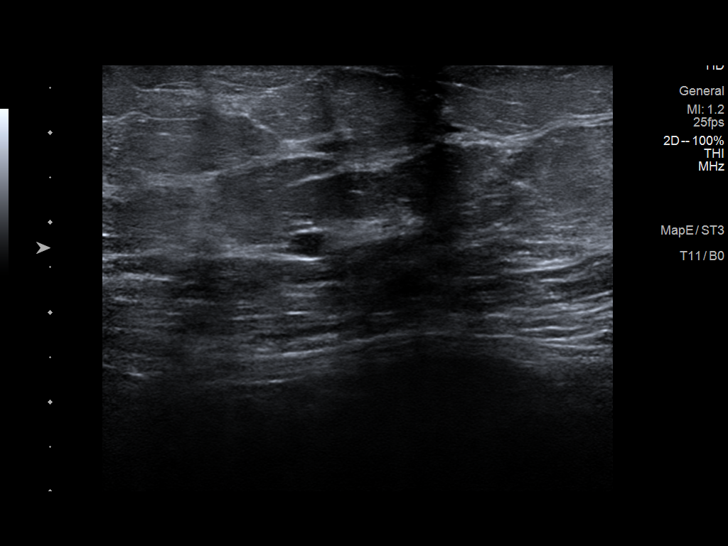
[im 5/10]
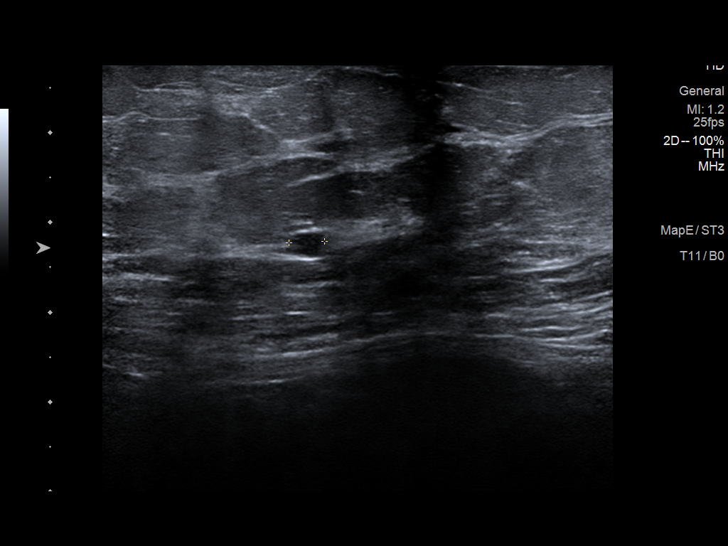
[im 6/10]
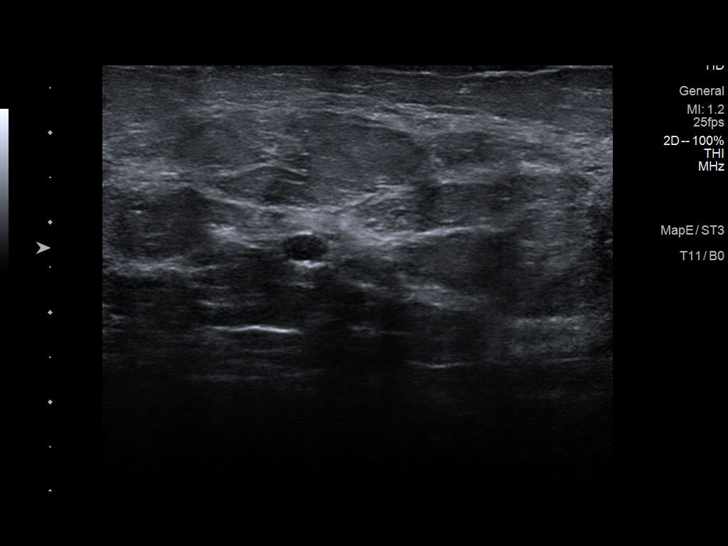
[im 7/10]
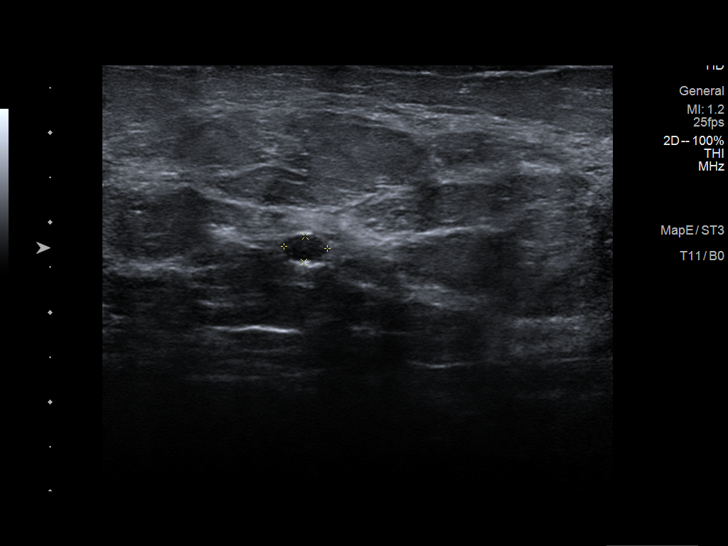
[im 8/10]
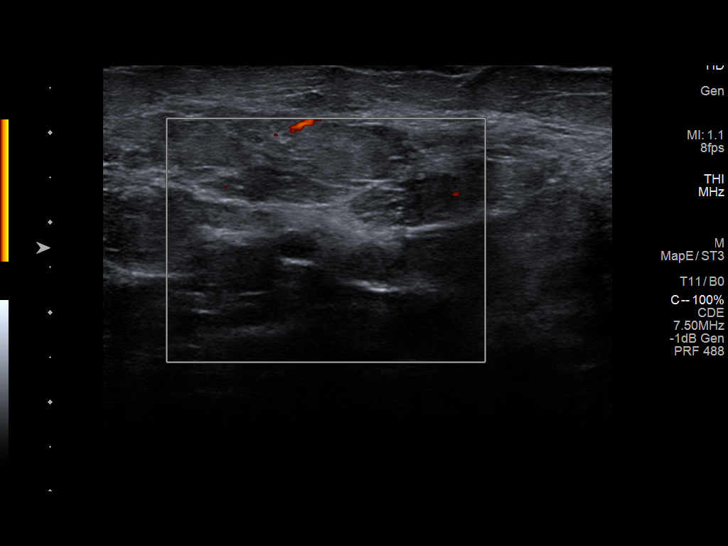
[im 9/10]
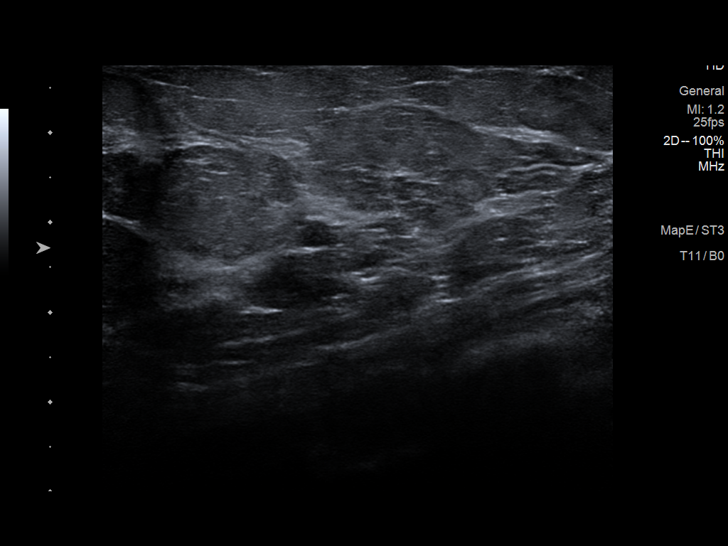
[im 10/10]
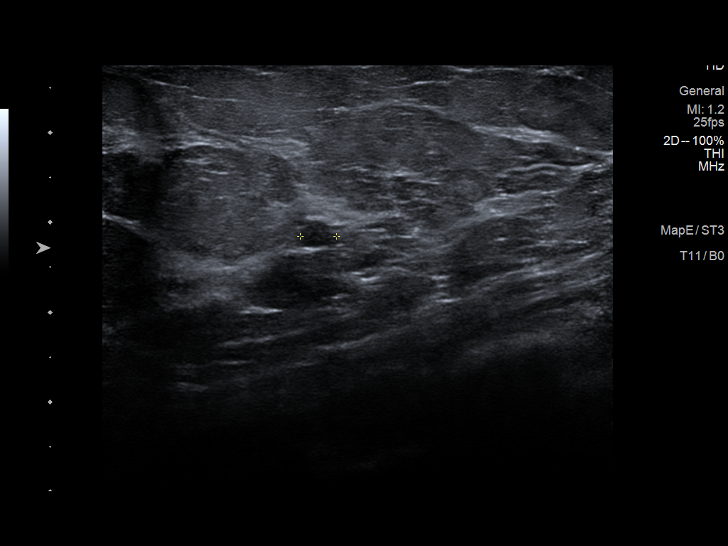

[10 of 10 positions shown; findings below may reference images not displayed]

ACR Breast Density Category c: The breast tissue is heterogeneously
dense, which may obscure small masses.
FINDINGS: The possible left breast asymmetry significantly improves on today's
imaging with no discrete mass or abnormality identified.

On physical exam, no suspicious lumps are identified

Targeted ultrasound is performed, showing numerous small cyst in the
left breast accounting for the mammographic findings. No evidence of
malignancy
IMPRESSION: Fibrocystic changes.  No evidence of malignancy.

RECOMMENDATION:
Annual screening mammography.

I have discussed the findings and recommendations with the patient.
Results were also provided in writing at the conclusion of the
visit. If applicable, a reminder letter will be sent to the patient
regarding the next appointment.

BI-RADS CATEGORY  2: Benign.

## 2020-12-25 IMAGING — MG DIGITAL DIAGNOSTIC UNILATERAL LEFT MAMMOGRAM WITH TOMO AND CAD
6 series · 6 of 18 positions shown · non-contrast
Comparison: Previous exam(s).

CLINICAL DATA: Patient was called for a possible left breast
asymmetry.

EXAM:
DIGITAL DIAGNOSTIC LEFT MAMMOGRAM WITH TOMO
ULTRASOUND LEFT BREAST

[L CC synth-2D (1 of 2)]
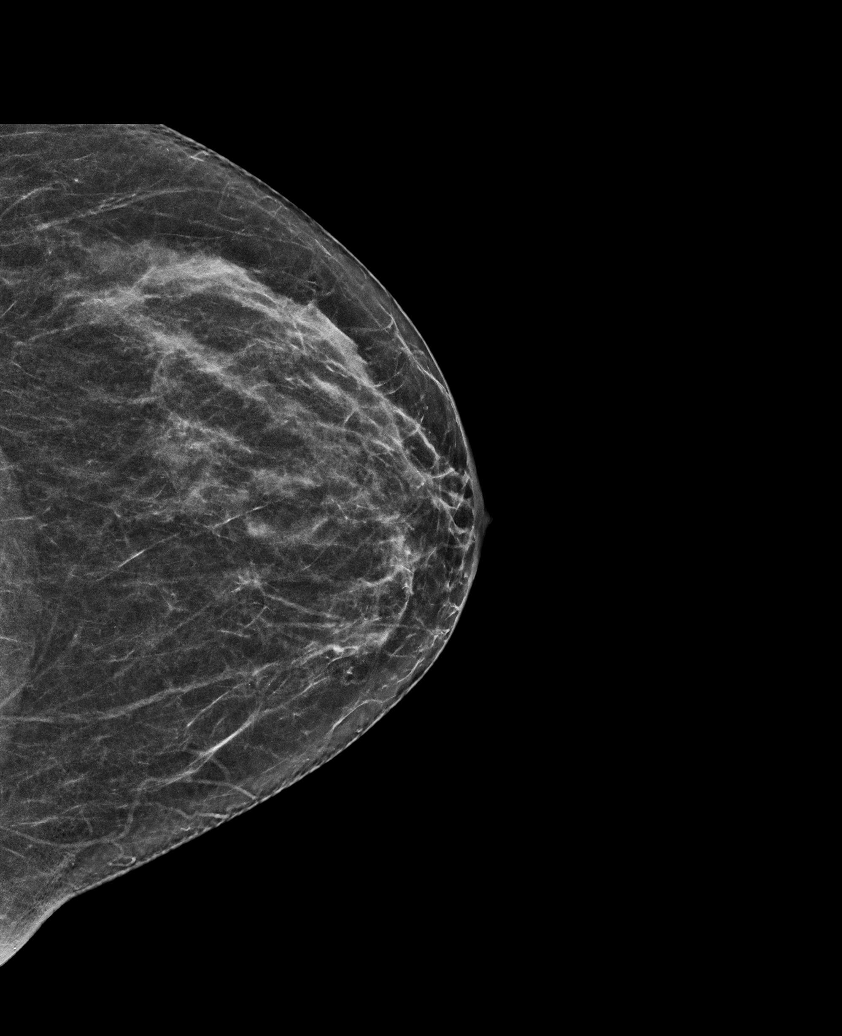

[L CC synth-2D (2 of 2)]
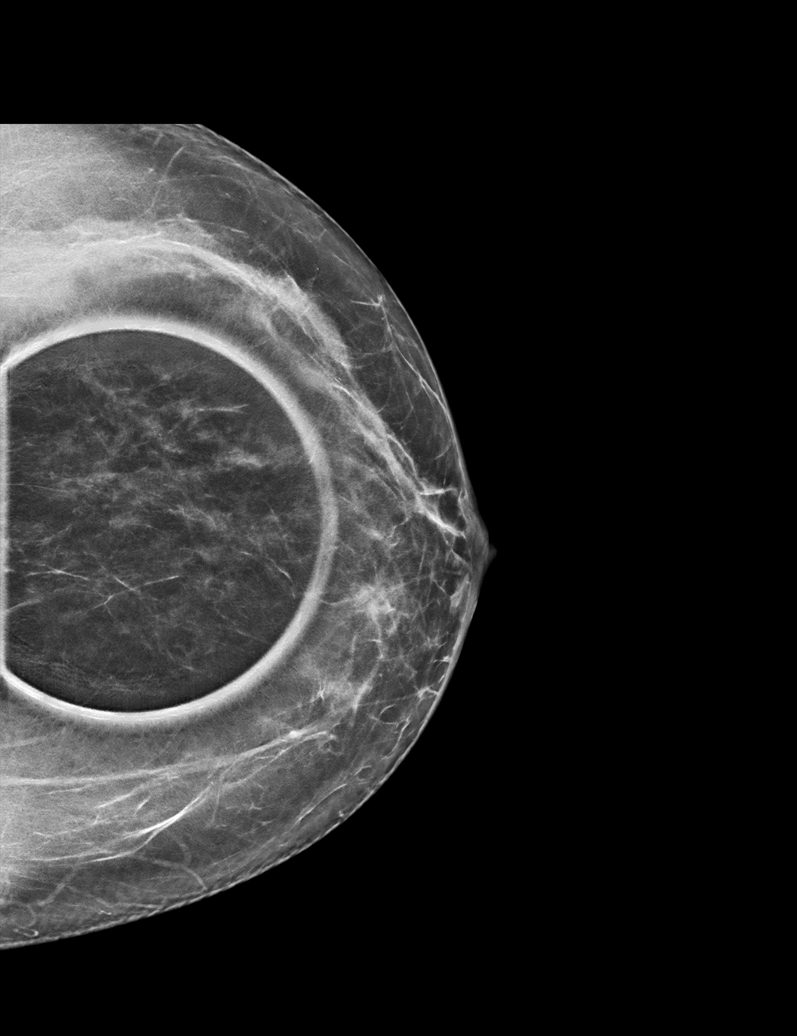

[L ML synth-2D]
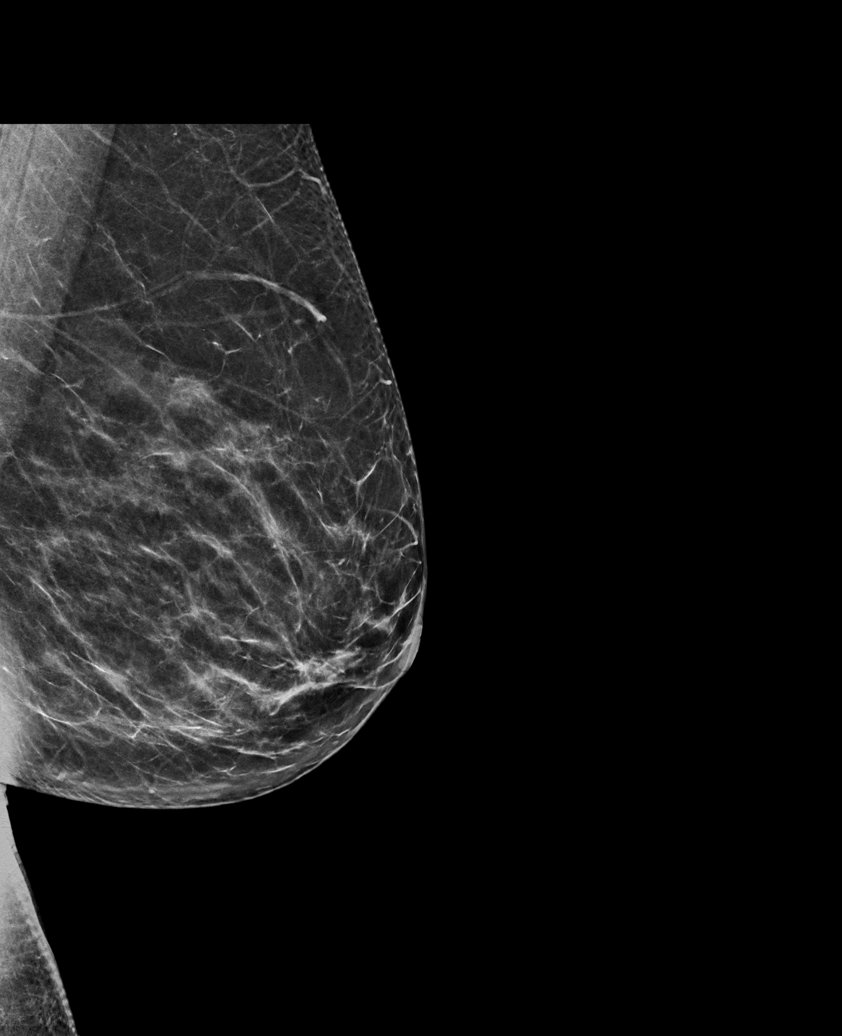

[L CC tomo (1 of 2) · tomo slice 31/61.0]
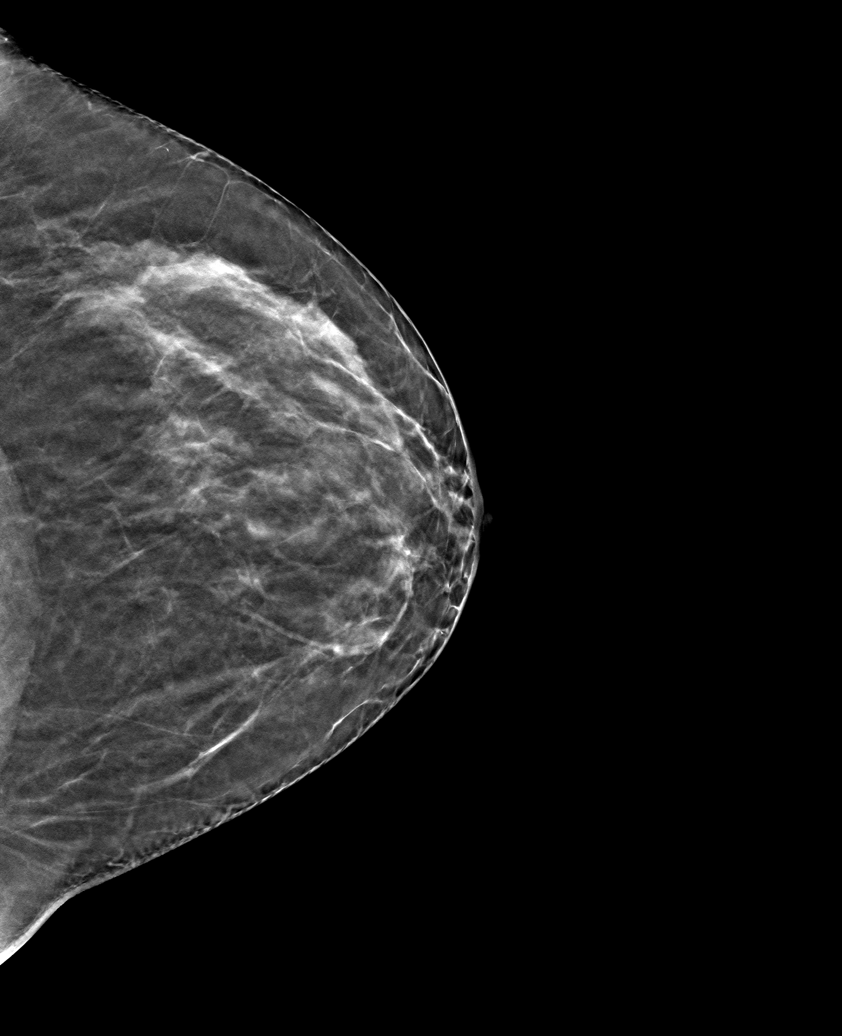

[L ML tomo · tomo slice 33/66.0]
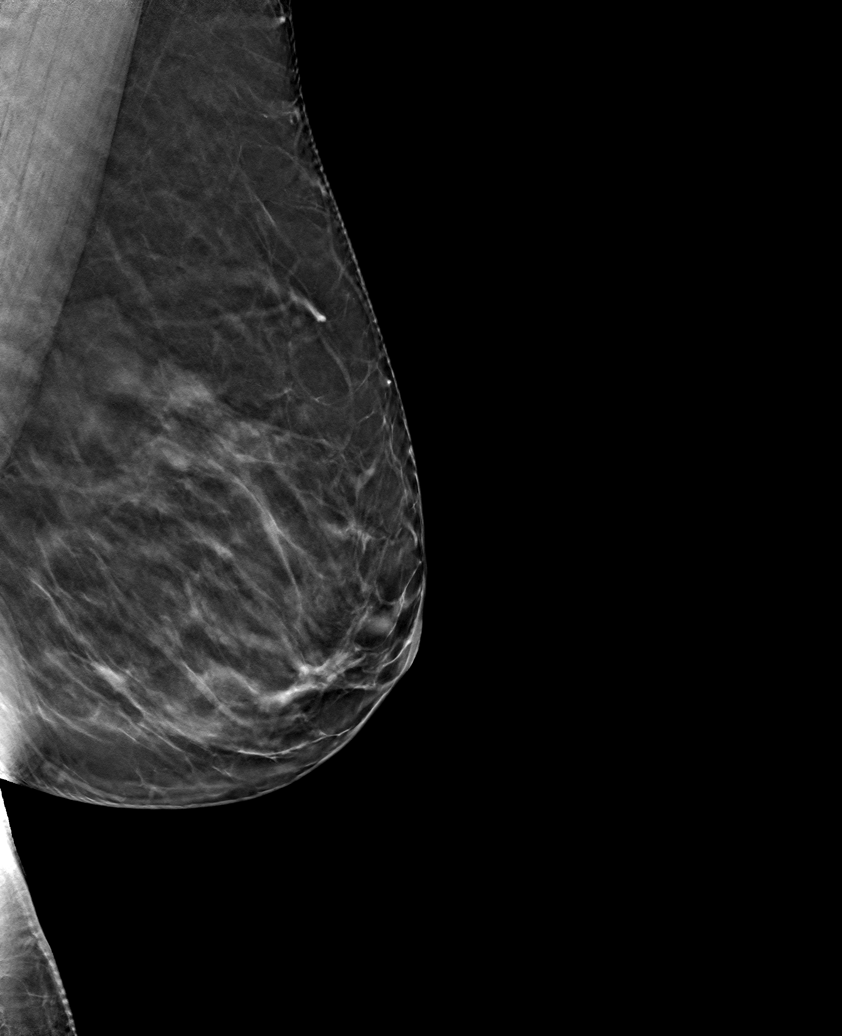

[L CC tomo (2 of 2) · tomo slice 30/59.0]
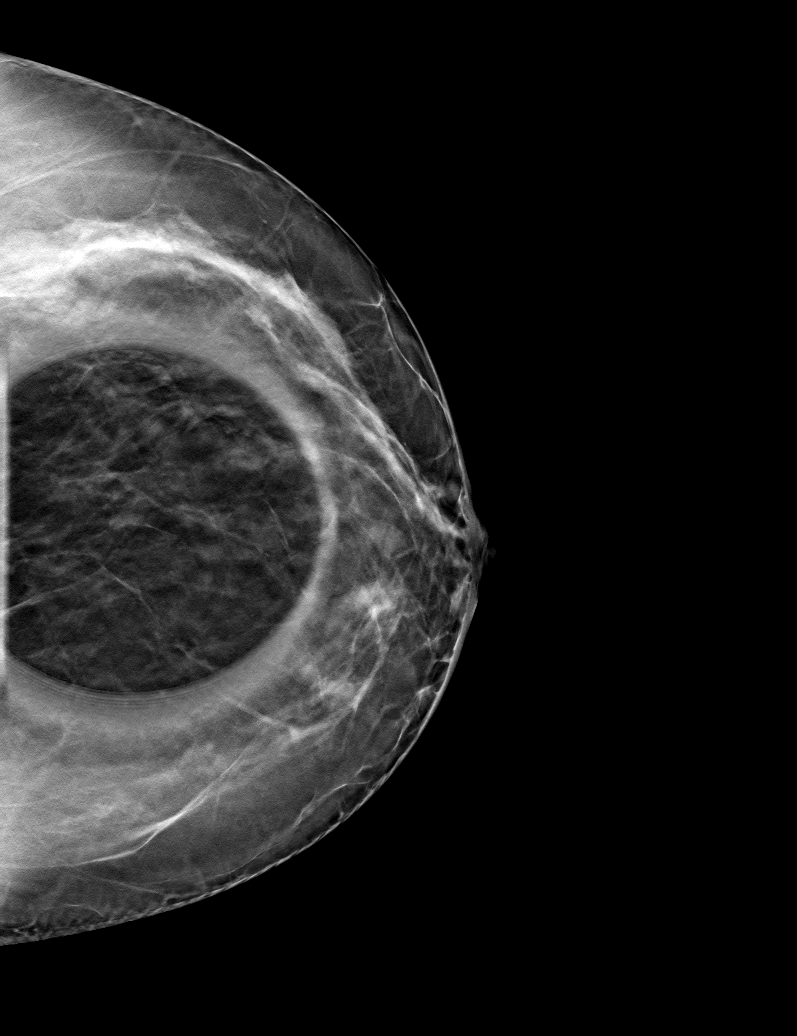

[6 of 18 positions shown; findings below may reference images not displayed]

ACR Breast Density Category c: The breast tissue is heterogeneously
dense, which may obscure small masses.
FINDINGS: The possible left breast asymmetry significantly improves on today's
imaging with no discrete mass or abnormality identified.

On physical exam, no suspicious lumps are identified

Targeted ultrasound is performed, showing numerous small cyst in the
left breast accounting for the mammographic findings. No evidence of
malignancy
IMPRESSION: Fibrocystic changes.  No evidence of malignancy.

RECOMMENDATION:
Annual screening mammography.

I have discussed the findings and recommendations with the patient.
Results were also provided in writing at the conclusion of the
visit. If applicable, a reminder letter will be sent to the patient
regarding the next appointment.

BI-RADS CATEGORY  2: Benign.

## 2021-01-03 DIAGNOSIS — M25511 Pain in right shoulder: Secondary | ICD-10-CM | POA: Diagnosis not present

## 2021-01-17 ENCOUNTER — Encounter: Payer: Self-pay | Admitting: Family Medicine

## 2021-01-19 ENCOUNTER — Other Ambulatory Visit: Payer: Self-pay | Admitting: Family Medicine

## 2021-01-19 DIAGNOSIS — I10 Essential (primary) hypertension: Secondary | ICD-10-CM

## 2021-01-19 NOTE — Telephone Encounter (Signed)
Requested medications are due for refill today.  yes  Requested medications are on the active medications list.  yes  Last refill. 12/24/2020  Future visit scheduled.   yes  Notes to clinic.  This will be the 3rd courtesy refill.

## 2021-01-21 ENCOUNTER — Other Ambulatory Visit: Payer: Self-pay | Admitting: Family Medicine

## 2021-01-21 DIAGNOSIS — N951 Menopausal and female climacteric states: Secondary | ICD-10-CM

## 2021-01-21 DIAGNOSIS — I10 Essential (primary) hypertension: Secondary | ICD-10-CM

## 2021-01-21 DIAGNOSIS — E039 Hypothyroidism, unspecified: Secondary | ICD-10-CM

## 2021-01-21 DIAGNOSIS — G43809 Other migraine, not intractable, without status migrainosus: Secondary | ICD-10-CM

## 2021-01-21 NOTE — Telephone Encounter (Signed)
Note attached to prescription "no further refills must be seen on upcoming appt for further refills"  Amlodipine: 12/24/20 #24 RF due: yes Levothyroxine: 10/25/20 #90 RF due: yes Gabapentin: 11/03/20 #90 RF due: yes

## 2021-01-26 ENCOUNTER — Encounter: Payer: Self-pay | Admitting: Family Medicine

## 2021-01-26 ENCOUNTER — Other Ambulatory Visit: Payer: Self-pay

## 2021-01-26 ENCOUNTER — Ambulatory Visit (INDEPENDENT_AMBULATORY_CARE_PROVIDER_SITE_OTHER): Payer: BC Managed Care – PPO | Admitting: Family Medicine

## 2021-01-26 VITALS — BP 112/71 | HR 83 | Resp 16 | Wt 151.0 lb

## 2021-01-26 DIAGNOSIS — F419 Anxiety disorder, unspecified: Secondary | ICD-10-CM

## 2021-01-26 DIAGNOSIS — Z0000 Encounter for general adult medical examination without abnormal findings: Secondary | ICD-10-CM | POA: Diagnosis not present

## 2021-01-26 DIAGNOSIS — I10 Essential (primary) hypertension: Secondary | ICD-10-CM | POA: Diagnosis not present

## 2021-01-26 DIAGNOSIS — E039 Hypothyroidism, unspecified: Secondary | ICD-10-CM

## 2021-01-26 DIAGNOSIS — N951 Menopausal and female climacteric states: Secondary | ICD-10-CM

## 2021-01-26 DIAGNOSIS — M19011 Primary osteoarthritis, right shoulder: Secondary | ICD-10-CM

## 2021-01-26 DIAGNOSIS — G43809 Other migraine, not intractable, without status migrainosus: Secondary | ICD-10-CM

## 2021-01-26 MED ORDER — PREDNISONE 20 MG PO TABS
20.0000 mg | ORAL_TABLET | Freq: Every day | ORAL | 0 refills | Status: DC
Start: 2021-01-26 — End: 2021-11-29

## 2021-01-26 NOTE — Progress Notes (Signed)
I,[name],acting as a scribe for [name], MD.,have documented all relevant documentation on the behalf of [name], MD,as directed by  [name], MD while in the presence of [name], MD.   Complete physical exam   Patient: Sheryl Tran   DOB: 1965-12-10   55 y.o. Female  MRN: [id number] Visit Date: 01/26/2021  Today's healthcare provider: [name], MD   Chief Complaint  Patient presents with   Annual Exam   Subjective    Sheryl Tran is a 55 y.o. female who presents today for a complete physical exam.  She reports consuming a general diet. Home exercise routine includes waking. She generally feels well. She reports sleeping well. She does not have additional problems to discuss today.  HPI  Patient is divorced and works for [employer].  Working from home since 2020.  Her son works for [employer] and her daughter is a Consulting civil engineer at [employer].  During failing health less starting to be a stressor. does have some right shoulder pain certain movements.  It is stiff.  No known trauma.  History reviewed. No pertinent past medical history. Past Surgical History:  Procedure Laterality Date   BREAST LUMPECTOMY     CHOLECYSTECTOMY  2006   Social History   Socioeconomic History   Marital status: Divorced    Spouse name: Not on file   Number of children: Not on file   Years of education: Not on file   Highest education level: Not on file  Occupational History   Not on file  Tobacco Use   Smoking status: Never   Smokeless tobacco: Never  Vaping Use   Vaping Use: Never used  Substance and Sexual Activity   Alcohol use: No    Alcohol/week: 0.0 standard drinks   Drug use: No   Sexual activity: Yes    Birth control/protection: Implant  Other Topics Concern   Not on file  Social History Narrative   Not on file   Social Determinants of Health   Financial Resource Strain: Not on file  Food Insecurity: Not on file   Transportation Needs: Not on file  Physical Activity: Not on file  Stress: Not on file  Social Connections: Not on file  Intimate Partner Violence: Not on file   Family Status  Relation Name Status   Mother  Alive   Father  Alive   Sister  Alive   MGM  (Not Specified)   MGF  (Not Specified)   PGM  (Not Specified)   Family History  Problem Relation Age of Onset   Hypertension Mother    Allergies Mother    Hypothyroidism Mother    Hypertension Father    Coronary artery disease Father    Cerebrovascular Disease Father    ADD / ADHD Sister    Lung cancer Maternal Grandmother    Prostate cancer Maternal Grandfather    CVA Paternal Grandmother    Allergies  Allergen Reactions   Amoxicillin Rash   Oseltamivir Rash   Penicillins Rash    Patient Care Team: Maple Hudson., MD as PCP - General (Family Medicine)   Medications: Outpatient Medications Prior to Visit  Medication Sig   amLODipine (NORVASC) 5 MG tablet TAKE 1 TABLET (5 MG TOTAL) BY MOUTH DAILY.   Calcium Carbonate-Vitamin D 600-200 MG-UNIT CAPS Take 1 tablet by mouth 2 (two) times daily.   furosemide (LASIX) 20 MG tablet TAKE 1 TABLET BY MOUTH EVERY  DAY AS NEEDED   gabapentin (NEURONTIN) 100 MG capsule TAKE 1 CAPSULE (100 MG TOTAL) BY MOUTH AT BEDTIME.   levothyroxine (SYNTHROID) 137 MCG tablet TAKE 1 TABLET BY MOUTH EVERY DAY   MULTIPLE VITAMINS-MINERALS PO Take 1 tablet by mouth daily.   buPROPion (WELLBUTRIN XL) 300 MG 24 hr tablet TAKE 1 TABLET BY MOUTH EVERY DAY (Patient not taking: Reported on 01/26/2021)   butalbital-aspirin-caffeine (FIORINAL) 50-325-40 MG capsule TAKE 1 TO 2 CAPSULES BY MOUTH EVERY 8 HOURS AS NEEDED (Patient not taking: Reported on 01/26/2021)   eszopiclone (LUNESTA) 1 MG TABS tablet Take 1 tablet (1 mg total) by mouth at bedtime as needed for sleep. May increase by 1mg  (1 tab) PO q hs weekly if needed to achieve sleep (titrating dose) (Patient not taking: No sig reported)   naproxen  (NAPROSYN) 500 MG tablet Take by mouth.   omeprazole (PRILOSEC) 20 MG capsule TAKE 1 CAPSULE BY MOUTH EVERY DAY (Patient not taking: Reported on 01/26/2021)   [DISCONTINUED] sertraline (ZOLOFT) 50 MG tablet TAKE 1 TABLET BY MOUTH EVERY DAY (Patient not taking: Reported on 01/26/2021)   [DISCONTINUED] sulfamethoxazole-trimethoprim (BACTRIM DS) 800-160 MG tablet Take 1 tablet by mouth 2 (two) times daily. (Patient not taking: No sig reported)   No facility-administered medications prior to visit.    Review of Systems     Objective    BP 112/71 (BP Location: Left Arm, Patient Position: Sitting, Cuff Size: Large)   Pulse 83   Resp 16   Wt 151 lb (68.5 kg)   SpO2 97%   BMI 26.75 kg/m  BP Readings from Last 3 Encounters:  01/26/21 112/71  10/13/19 130/80  04/30/19 (!) 144/90   Wt Readings from Last 3 Encounters:  01/26/21 151 lb (68.5 kg)  10/13/19 172 lb 3.2 oz (78.1 kg)  04/30/19 173 lb (78.5 kg)      Physical Exam Vitals reviewed.  Constitutional:      Appearance: She is well-developed.  HENT:     Head: Normocephalic and atraumatic.     Right Ear: External ear normal.     Left Ear: External ear normal.     Nose: Nose normal.  Eyes:     General: No scleral icterus.    Conjunctiva/sclera: Conjunctivae normal.  Neck:     Thyroid: No thyromegaly.  Cardiovascular:     Rate and Rhythm: Normal rate and regular rhythm.     Heart sounds: Normal heart sounds.  Pulmonary:     Effort: Pulmonary effort is normal.     Breath sounds: Normal breath sounds.  Abdominal:     Palpations: Abdomen is soft.  Musculoskeletal:     Right lower leg: No edema.     Left lower leg: No edema.  Skin:    General: Skin is warm and dry.  Neurological:     General: No focal deficit present.     Mental Status: She is alert and oriented to person, place, and time.  Psychiatric:        Mood and Affect: Mood normal.        Behavior: Behavior normal.        Thought Content: Thought content normal.         Judgment: Judgment normal.      Last depression screening scores PHQ 2/9 Scores 10/13/2019 07/11/2017 06/25/2017  PHQ - 2 Score 1 1 0  PHQ- 9 Score 5 3 3    Last fall risk screening Fall Risk  10/13/2019  Falls in the past  year? 0  Number falls in past yr: 0  Injury with Fall? 0  Follow up Falls evaluation completed   Last Audit-C alcohol use screening Alcohol Use Disorder Test (AUDIT) 10/13/2019  1. How often do you have a drink containing alcohol? 1  2. How many drinks containing alcohol do you have on a typical day when you are drinking? 0  3. How often do you have six or more drinks on one occasion? 0  AUDIT-C Score 1   A score of 3 or more in women, and 4 or more in men indicates increased risk for alcohol abuse, EXCEPT if all of the points are from question 1   No results found for any visits on 01/26/21.  Assessment & Plan    Routine Health Maintenance and Physical Exam  Exercise Activities and Dietary recommendations  Goals   None     Immunization History  Administered Date(s) Administered   Influenza Whole 04/18/2017   Influenza,inj,Quad PF,6+ Mos 03/12/2015, 03/05/2016, 04/05/2017, 03/03/2018   Tdap 10/07/2008    Health Maintenance  Topic Date Due   COVID-19 Vaccine (1) Never done   Hepatitis C Screening  Never done   Zoster Vaccines- Shingrix (1 of 2) Never done   TETANUS/TDAP  10/08/2018   MAMMOGRAM  05/31/2019   PAP SMEAR-Modifier  05/30/2020   INFLUENZA VACCINE  01/16/2021   COLONOSCOPY (Pts 45-92yrs Insurance coverage will need to be confirmed)  09/17/2027   HIV Screening  Completed   Pneumococcal Vaccine 60-85 Years old  Aged Out   HPV VACCINES  Aged Out    Discussed health benefits of physical activity, and encouraged her to engage in regular exercise appropriate for her age and condition.  1. Annual physical exam Well woman exam per GYN. - Lipid panel - TSH - CBC w/Diff/Platelet - Comprehensive Metabolic Panel (CMET)  2.  Hypertension, unspecified type  - Lipid panel - TSH - CBC w/Diff/Platelet - Comprehensive Metabolic Panel (CMET)  3. Adult hypothyroidism  - Lipid panel - TSH - CBC w/Diff/Platelet - Comprehensive Metabolic Panel (CMET)  4. Anxiety  - Lipid panel - TSH - CBC w/Diff/Platelet - Comprehensive Metabolic Panel (CMET)  5. Arthropathy of right shoulder May need orthopedic referral. - predniSONE (DELTASONE) 20 MG tablet; Take 1 tablet (20 mg total) by mouth daily with breakfast.  Dispense: 5 tablet; Refill: 0   Return in about 6 months (around 07/29/2021).     I, [name], MD, have reviewed all documentation for this visit. The documentation on 02/04/21 for the exam, diagnosis, procedures, and orders are all accurate and complete.    Caedan Sumler Wendelyn Breslow, MD  [facility] [phone] (phone) [phone] (fax)  [facility]

## 2021-01-26 NOTE — Progress Notes (Deleted)
I,Darron Stuck,acting as a scribe for [name], MD.,have documented all relevant documentation on the behalf of [name], MD,as directed by  [name], MD while in the presence of [name], MD.   Established patient visit   Patient: Sheryl Tran   DOB: [birth year]   55 y.o. Female  MRN: [id number] Visit Date: 01/26/2021  Today's healthcare provider: [name], MD   No chief complaint on file.  Subjective    HPI  Hypertension, follow-up  BP Readings from Last 3 Encounters:  01/26/21 112/71  10/13/19 130/80  04/30/19 (!) 144/90   Wt Readings from Last 3 Encounters:  01/26/21 151 lb (68.5 kg)  10/13/19 172 lb 3.2 oz (78.1 kg)  04/30/19 173 lb (78.5 kg)     She was last seen for hypertension 10/13/2019.  BP at that visit was 130/80. Management since that visit includes; Controlled on amlodipine. She reports good compliance with treatment. She is not having side effects. none She is exercising. She is not adherent to low salt diet.   Outside blood pressures are none.  She does not smoke.  Use of agents associated with hypertension: none.   --------------------------------------------------------------------------------------------------- Hypothyroid, follow-up  Lab Results  Component Value Date   TSH 3.650 10/15/2019   TSH 4.130 07/31/2018   TSH 1.780 07/16/2017   Wt Readings from Last 3 Encounters:  01/26/21 151 lb (68.5 kg)  10/13/19 172 lb 3.2 oz (78.1 kg)  04/30/19 173 lb (78.5 kg)    She was last seen for hypothyroid 10/13/2019.  Management since that visit includes; labs checked showing-stable. Advised to work on diet and exercise. She reports good compliance with treatment. She is not having side effects. none  -----------------------------------------------------------------------------------------    {Show patient history (optional):23778}   Medications: Outpatient Medications Prior to Visit   Medication Sig   amLODipine (NORVASC) 5 MG tablet TAKE 1 TABLET (5 MG TOTAL) BY MOUTH DAILY.   Calcium Carbonate-Vitamin D 600-200 MG-UNIT CAPS Take 1 tablet by mouth 2 (two) times daily.   furosemide (LASIX) 20 MG tablet TAKE 1 TABLET BY MOUTH EVERY DAY AS NEEDED   gabapentin (NEURONTIN) 100 MG capsule TAKE 1 CAPSULE (100 MG TOTAL) BY MOUTH AT BEDTIME.   levothyroxine (SYNTHROID) 137 MCG tablet TAKE 1 TABLET BY MOUTH EVERY DAY   MULTIPLE VITAMINS-MINERALS PO Take 1 tablet by mouth daily.   buPROPion (WELLBUTRIN XL) 300 MG 24 hr tablet TAKE 1 TABLET BY MOUTH EVERY DAY (Patient not taking: Reported on 01/26/2021)   butalbital-aspirin-caffeine (FIORINAL) 50-325-40 MG capsule TAKE 1 TO 2 CAPSULES BY MOUTH EVERY 8 HOURS AS NEEDED (Patient not taking: Reported on 01/26/2021)   eszopiclone (LUNESTA) 1 MG TABS tablet Take 1 tablet (1 mg total) by mouth at bedtime as needed for sleep. May increase by 1mg  (1 tab) PO q hs weekly if needed to achieve sleep (titrating dose) (Patient not taking: No sig reported)   naproxen (NAPROSYN) 500 MG tablet Take by mouth.   omeprazole (PRILOSEC) 20 MG capsule TAKE 1 CAPSULE BY MOUTH EVERY DAY (Patient not taking: Reported on 01/26/2021)   [DISCONTINUED] sertraline (ZOLOFT) 50 MG tablet TAKE 1 TABLET BY MOUTH EVERY DAY (Patient not taking: Reported on 01/26/2021)   [DISCONTINUED] sulfamethoxazole-trimethoprim (BACTRIM DS) 800-160 MG tablet Take 1 tablet by mouth 2 (two) times daily. (Patient not taking: No sig reported)   No facility-administered medications prior to visit.    Review of Systems  Constitutional:  Negative for appetite  change, chills, fatigue and fever.  Respiratory:  Negative for chest tightness and shortness of breath.   Cardiovascular:  Negative for chest pain and palpitations.  Gastrointestinal:  Negative for abdominal pain, nausea and vomiting.  Neurological:  Negative for dizziness and weakness.   {Labs  Heme  Chem  Endocrine  Serology   Results Review (optional):23779}   Objective    BP 112/71 (BP Location: Left Arm, Patient Position: Sitting, Cuff Size: Large)   Pulse 83   Resp 16   Wt 151 lb (68.5 kg)   SpO2 97%   BMI 26.75 kg/m  {Show previous vital signs (optional):23777}   Physical Exam  ***  No results found for any visits on 01/26/21.  Assessment & Plan     ***  No follow-ups on file.      {provider attestation***:1}   [name], MD  [facility] [phone] (phone) [phone] (fax)  [facility]

## 2021-01-27 MED ORDER — LEVOTHYROXINE SODIUM 137 MCG PO TABS: 137.0000 ug | ORAL_TABLET | Freq: Every day | ORAL | 1 refills | Status: DC

## 2021-01-27 MED ORDER — AMLODIPINE BESYLATE 5 MG PO TABS: 5.0000 mg | ORAL_TABLET | Freq: Every day | ORAL | 1 refills | Status: DC

## 2021-01-27 MED ORDER — GABAPENTIN 100 MG PO CAPS: 100.0000 mg | ORAL_CAPSULE | Freq: Every day | ORAL | 1 refills | Status: DC

## 2021-02-01 DIAGNOSIS — Z0000 Encounter for general adult medical examination without abnormal findings: Secondary | ICD-10-CM | POA: Diagnosis not present

## 2021-02-02 LAB — CBC WITH DIFFERENTIAL/PLATELET
Basophils Absolute: 0 10*3/uL (ref 0.0–0.2)
Basos: 1 %
EOS (ABSOLUTE): 0.1 10*3/uL (ref 0.0–0.4)
Eos: 1 %
Hematocrit: 39.6 % (ref 34.0–46.6)
Hemoglobin: 12.8 g/dL (ref 11.1–15.9)
Immature Grans (Abs): 0 10*3/uL (ref 0.0–0.1)
Immature Granulocytes: 0 %
Lymphocytes Absolute: 2.1 10*3/uL (ref 0.7–3.1)
Lymphs: 35 %
MCH: 26.5 pg — ABNORMAL LOW (ref 26.6–33.0)
MCHC: 32.3 g/dL (ref 31.5–35.7)
MCV: 82 fL (ref 79–97)
Monocytes Absolute: 0.5 10*3/uL (ref 0.1–0.9)
Monocytes: 8 %
Neutrophils Absolute: 3.3 10*3/uL (ref 1.4–7.0)
Neutrophils: 55 %
Platelets: 316 10*3/uL (ref 150–450)
RBC: 4.83 x10E6/uL (ref 3.77–5.28)
RDW: 14.3 % (ref 11.7–15.4)
WBC: 6 10*3/uL (ref 3.4–10.8)

## 2021-02-02 LAB — COMPREHENSIVE METABOLIC PANEL
ALT: 17 IU/L (ref 0–32)
AST: 24 IU/L (ref 0–40)
Albumin/Globulin Ratio: 2 (ref 1.2–2.2)
Albumin: 4.7 g/dL (ref 3.8–4.9)
Alkaline Phosphatase: 81 IU/L (ref 44–121)
BUN/Creatinine Ratio: 11 (ref 9–23)
BUN: 7 mg/dL (ref 6–24)
Bilirubin Total: 0.4 mg/dL (ref 0.0–1.2)
CO2: 20 mmol/L (ref 20–29)
Calcium: 9.8 mg/dL (ref 8.7–10.2)
Chloride: 99 mmol/L (ref 96–106)
Creatinine, Ser: 0.65 mg/dL (ref 0.57–1.00)
Globulin, Total: 2.3 g/dL (ref 1.5–4.5)
Glucose: 100 mg/dL — ABNORMAL HIGH (ref 65–99)
Potassium: 4.1 mmol/L (ref 3.5–5.2)
Sodium: 136 mmol/L (ref 134–144)
Total Protein: 7 g/dL (ref 6.0–8.5)
eGFR: 105 mL/min/{1.73_m2} (ref >59)

## 2021-02-02 LAB — LIPID PANEL
Chol/HDL Ratio: 3.1 ratio (ref 0.0–4.4)
Cholesterol, Total: 188 mg/dL (ref 100–199)
HDL: 60 mg/dL (ref >39)
LDL Chol Calc (NIH): 107 mg/dL — ABNORMAL HIGH (ref 0–99)
Triglycerides: 119 mg/dL (ref 0–149)
VLDL Cholesterol Cal: 21 mg/dL (ref 5–40)

## 2021-02-02 LAB — TSH: TSH: 1.14 u[IU]/mL (ref 0.450–4.500)

## 2021-02-24 ENCOUNTER — Encounter: Payer: Self-pay | Admitting: Family Medicine

## 2021-05-30 DIAGNOSIS — M5412 Radiculopathy, cervical region: Secondary | ICD-10-CM | POA: Diagnosis not present

## 2021-05-30 DIAGNOSIS — M9901 Segmental and somatic dysfunction of cervical region: Secondary | ICD-10-CM | POA: Diagnosis not present

## 2021-05-30 DIAGNOSIS — M62830 Muscle spasm of back: Secondary | ICD-10-CM | POA: Diagnosis not present

## 2021-05-30 DIAGNOSIS — M9902 Segmental and somatic dysfunction of thoracic region: Secondary | ICD-10-CM | POA: Diagnosis not present

## 2021-06-03 ENCOUNTER — Other Ambulatory Visit: Payer: Self-pay | Admitting: Family Medicine

## 2021-06-03 DIAGNOSIS — I10 Essential (primary) hypertension: Secondary | ICD-10-CM

## 2021-06-04 NOTE — Telephone Encounter (Signed)
Requested Prescriptions  Pending Prescriptions Disp Refills   furosemide (LASIX) 20 MG tablet [Pharmacy Med Name: FUROSEMIDE 20 MG TABLET] 90 tablet 0    Sig: TAKE 1 TABLET BY MOUTH EVERY DAY AS NEEDED     Cardiovascular:  Diuretics - Loop Passed - 06/03/2021  1:13 AM      Passed - K in normal range and within 360 days    Potassium  Date Value Ref Range Status  02/01/2021 4.1 3.5 - 5.2 mmol/L Final         Passed - Ca in normal range and within 360 days    Calcium  Date Value Ref Range Status  02/01/2021 9.8 8.7 - 10.2 mg/dL Final         Passed - Na in normal range and within 360 days    Sodium  Date Value Ref Range Status  02/01/2021 136 134 - 144 mmol/L Final         Passed - Cr in normal range and within 360 days    Creatinine, Ser  Date Value Ref Range Status  02/01/2021 0.65 0.57 - 1.00 mg/dL Final         Passed - Last BP in normal range    BP Readings from Last 1 Encounters:  01/26/21 112/71         Passed - Valid encounter within last 6 months    Recent Outpatient Visits          4 months ago Annual physical exam   [facility] Maple Hudson., MD   1 year ago Anxiety   [facility] Maple Hudson., MD   2 years ago Dysuria   [facility] [place], [place], [place]   2 years ago Encounter for annual physical exam   [facility] Maple Hudson., MD   3 years ago Lymphadenitis   [facility] Maple Hudson., MD      Future Appointments            In 3 months Maple Hudson., MD [facility]

## 2021-07-18 ENCOUNTER — Other Ambulatory Visit: Payer: Self-pay | Admitting: Family Medicine

## 2021-07-18 DIAGNOSIS — E039 Hypothyroidism, unspecified: Secondary | ICD-10-CM

## 2021-07-18 DIAGNOSIS — I10 Essential (primary) hypertension: Secondary | ICD-10-CM

## 2021-07-18 DIAGNOSIS — N951 Menopausal and female climacteric states: Secondary | ICD-10-CM

## 2021-07-18 DIAGNOSIS — G43809 Other migraine, not intractable, without status migrainosus: Secondary | ICD-10-CM

## 2021-08-21 DIAGNOSIS — Z1231 Encounter for screening mammogram for malignant neoplasm of breast: Secondary | ICD-10-CM | POA: Diagnosis not present

## 2021-08-21 DIAGNOSIS — Z130 Encounter for screening for diseases of the blood and blood-forming organs and certain disorders involving the immune mechanism: Secondary | ICD-10-CM | POA: Diagnosis not present

## 2021-08-21 DIAGNOSIS — Z01419 Encounter for gynecological examination (general) (routine) without abnormal findings: Secondary | ICD-10-CM | POA: Diagnosis not present

## 2021-08-21 DIAGNOSIS — Z1389 Encounter for screening for other disorder: Secondary | ICD-10-CM | POA: Diagnosis not present

## 2021-08-21 DIAGNOSIS — Z124 Encounter for screening for malignant neoplasm of cervix: Secondary | ICD-10-CM | POA: Diagnosis not present

## 2021-08-21 DIAGNOSIS — E669 Obesity, unspecified: Secondary | ICD-10-CM | POA: Diagnosis not present

## 2021-09-18 ENCOUNTER — Encounter: Payer: BC Managed Care – PPO | Admitting: Family Medicine

## 2021-10-09 DIAGNOSIS — M62830 Muscle spasm of back: Secondary | ICD-10-CM | POA: Diagnosis not present

## 2021-10-09 DIAGNOSIS — M9901 Segmental and somatic dysfunction of cervical region: Secondary | ICD-10-CM | POA: Diagnosis not present

## 2021-10-09 DIAGNOSIS — M5412 Radiculopathy, cervical region: Secondary | ICD-10-CM | POA: Diagnosis not present

## 2021-10-09 DIAGNOSIS — M9902 Segmental and somatic dysfunction of thoracic region: Secondary | ICD-10-CM | POA: Diagnosis not present

## 2021-10-17 ENCOUNTER — Other Ambulatory Visit: Payer: Self-pay | Admitting: Family Medicine

## 2021-10-17 DIAGNOSIS — I10 Essential (primary) hypertension: Secondary | ICD-10-CM

## 2021-10-17 NOTE — Telephone Encounter (Signed)
Rx 07/18/21 #90 1RF- too soon ?Requested Prescriptions  ?Pending Prescriptions Disp Refills  ?? amLODipine (NORVASC) 5 MG tablet [Pharmacy Med Name: AMLODIPINE BESYLATE 5 MG TAB] 90 tablet 1  ?  Sig: TAKE 1 TABLET (5 MG TOTAL) BY MOUTH DAILY.  ?  ? Cardiovascular: Calcium Channel Blockers 2 Failed - 10/17/2021  2:18 AM  ?  ?  Failed - Valid encounter within last 6 months  ?  Recent Outpatient Visits   ?      ? 8 months ago Annual physical exam  ? [facility] Maple Hudson., MD  ? 2 years ago Anxiety  ? [facility] Maple Hudson., MD  ? 2 years ago Dysuria  ? [facility] [place], [place], [place]  ? 3 years ago Encounter for annual physical exam  ? [facility] Maple Hudson., MD  ? 3 years ago Lymphadenitis  ? [facility] Maple Hudson., MD  ?  ?  ?Future Appointments   ?        ? In 1 month Maple Hudson., MD [facility]  ?  ? ?  ?  ?  Passed - Last BP in normal range  ?  BP Readings from Last 1 Encounters:  ?01/26/21 112/71  ?   ?  ?  Passed - Last Heart Rate in normal range  ?  Pulse Readings from Last 1 Encounters:  ?01/26/21 83  ?   ?  ?  ? ?

## 2021-10-23 MED ORDER — AMLODIPINE BESYLATE 5 MG PO TABS: 5.0000 mg | ORAL_TABLET | Freq: Every day | ORAL | 0 refills | Status: DC

## 2021-10-23 NOTE — Telephone Encounter (Signed)
Pt has [facility] appt on 11/29/21. Pt reports that she has 5 pills left. Please advise  ?

## 2021-10-23 NOTE — Addendum Note (Signed)
Addended by: Carlean Purl on: 10/23/2021 11:31 AM ? ? Modules accepted: Orders ? ?

## 2021-10-23 NOTE — Telephone Encounter (Signed)
No refills available ?Requested Prescriptions  ?Pending Prescriptions Disp Refills  ?? amLODipine (NORVASC) 5 MG tablet 30 tablet 0  ?  Sig: Take 1 tablet (5 mg total) by mouth daily.  ?  ? Cardiovascular: Calcium Channel Blockers 2 Failed - 10/23/2021 11:32 AM  ?  ?  Failed - Valid encounter within last 6 months  ?  Recent Outpatient Visits   ?      ? 9 months ago Annual physical exam  ? [facility] Jerrol Banana., MD  ? 2 years ago Anxiety  ? [facility] Jerrol Banana., MD  ? 2 years ago Dysuria  ? [facility] [place], [place], [place]  ? 3 years ago Encounter for annual physical exam  ? [facility] Jerrol Banana., MD  ? 3 years ago Lymphadenitis  ? [facility] Jerrol Banana., MD  ?  ?  ?Future Appointments   ?        ? In 1 month Jerrol Banana., MD [facility]  ?  ? ?  ?  ?  Passed - Last BP in normal range  ?  BP Readings from Last 1 Encounters:  ?01/26/21 112/71  ?   ?  ?  Passed - Last Heart Rate in normal range  ?  Pulse Readings from Last 1 Encounters:  ?01/26/21 83  ?   ?  ?  ?Refused Prescriptions Disp Refills  ?? amLODipine (NORVASC) 5 MG tablet [Pharmacy Med Name: AMLODIPINE BESYLATE 5 MG TAB] 90 tablet 1  ?  Sig: TAKE 1 TABLET (5 MG TOTAL) BY MOUTH DAILY.  ?  ? Cardiovascular: Calcium Channel Blockers 2 Failed - 10/23/2021 11:32 AM  ?  ?  Failed - Valid encounter within last 6 months  ?  Recent Outpatient Visits   ?      ? 9 months ago Annual physical exam  ? [facility] Jerrol Banana., MD  ? 2 years ago Anxiety  ? [facility] Jerrol Banana., MD  ? 2 years ago Dysuria  ? [facility] [place], [place], [place]  ? 3 years ago Encounter for annual physical exam  ? [facility] Jerrol Banana., MD  ? 3 years ago Lymphadenitis  ? [facility] Jerrol Banana., MD  ?  ?  ?Future  Appointments   ?        ? In 1 month Jerrol Banana., MD [facility]  ?  ? ?  ?  ?  Passed - Last BP in normal range  ?  BP Readings from Last 1 Encounters:  ?01/26/21 112/71  ?   ?  ?  Passed - Last Heart Rate in normal range  ?  Pulse Readings from Last 1 Encounters:  ?01/26/21 83  ?   ?  ?  ? ? ?

## 2021-11-20 ENCOUNTER — Other Ambulatory Visit: Payer: Self-pay | Admitting: Family Medicine

## 2021-11-20 DIAGNOSIS — I10 Essential (primary) hypertension: Secondary | ICD-10-CM

## 2021-11-28 NOTE — Progress Notes (Signed)
Complete physical exam  I,[name],acting as a scribe for Wilhemena Durie, MD.,have documented all relevant documentation on the behalf of Wilhemena Durie, MD,as directed by  Wilhemena Durie, MD while in the presence of Wilhemena Durie, MD.   Patient: Sheryl Tran   DOB: 01/13/1966   56 y.o. Female  MRN: [id number] Visit Date: 11/29/2021  Today's healthcare provider: Wilhemena Durie, MD   Chief Complaint  Patient presents with   Annual Exam   Subjective    Sheryl Tran is a 56 y.o. female who presents today for a complete physical exam.  She reports consuming a general diet. Home exercise routine includes walking. She generally feels well. She reports sleeping well. She does have additional problems to discuss today.  Overall she is feeling well. Her youngest child just graduated from college.  She is divorced and continues to work from home. She sees GYN for well woman care and continues to have regular menstrual cycles which they are following. HPI  She has some mild recent right flank pain but over the last few months she has noticed occasional right-sided neck discomfort.  No radicular symptoms.  No rashes.  History reviewed. No pertinent past medical history. Past Surgical History:  Procedure Laterality Date   BREAST LUMPECTOMY     CHOLECYSTECTOMY  2006   Social History   Socioeconomic History   Marital status: Divorced    Spouse name: Not on file   Number of children: Not on file   Years of education: Not on file   Highest education level: Not on file  Occupational History   Not on file  Tobacco Use   Smoking status: Never   Smokeless tobacco: Never  Vaping Use   Vaping Use: Never used  Substance and Sexual Activity   Alcohol use: No    Alcohol/week: 0.0 standard drinks of alcohol   Drug use: No   Sexual activity: Yes    Birth control/protection: Implant  Other Topics Concern   Not on file  Social History Narrative   Not on file    Social Determinants of Health   Financial Resource Strain: Not on file  Food Insecurity: Not on file  Transportation Needs: Not on file  Physical Activity: Not on file  Stress: Not on file  Social Connections: Not on file  Intimate Partner Violence: Not on file   Family Status  Relation Name Status   Mother  Alive   Father  Alive   Sister  Alive   MGM  (Not Specified)   MGF  (Not Specified)   PGM  (Not Specified)   Family History  Problem Relation Age of Onset   Hypertension Mother    Allergies Mother    Hypothyroidism Mother    Hypertension Father    Coronary artery disease Father    Cerebrovascular Disease Father    ADD / ADHD Sister    Lung cancer Maternal Grandmother    Prostate cancer Maternal Grandfather    CVA Paternal Grandmother    Allergies  Allergen Reactions   Amoxicillin Rash   Oseltamivir Rash   Penicillins Rash    Patient Care Team: Jerrol Banana., MD as PCP - General (Family Medicine)   Medications: Outpatient Medications Prior to Visit  Medication Sig   amLODipine (NORVASC) 5 MG tablet TAKE 1 TABLET (5 MG TOTAL) BY MOUTH DAILY.   Calcium Carbonate-Vitamin D 600-200 MG-UNIT CAPS Take 1 tablet by mouth 2 (two)  times daily.   furosemide (LASIX) 20 MG tablet TAKE 1 TABLET BY MOUTH EVERY DAY AS NEEDED   gabapentin (NEURONTIN) 100 MG capsule TAKE 1 CAPSULE BY MOUTH AT BEDTIME.   MULTIPLE VITAMINS-MINERALS PO Take 1 tablet by mouth daily.   naproxen (NAPROSYN) 500 MG tablet Take by mouth.   [DISCONTINUED] levothyroxine (SYNTHROID) 137 MCG tablet TAKE 1 TABLET BY MOUTH EVERY DAY   [DISCONTINUED] buPROPion (WELLBUTRIN XL) 300 MG 24 hr tablet TAKE 1 TABLET BY MOUTH EVERY DAY (Patient not taking: Reported on 01/26/2021)   [DISCONTINUED] butalbital-aspirin-caffeine (FIORINAL) 50-325-40 MG capsule TAKE 1 TO 2 CAPSULES BY MOUTH EVERY 8 HOURS AS NEEDED (Patient not taking: Reported on 01/26/2021)   [DISCONTINUED] eszopiclone (LUNESTA) 1 MG TABS tablet  Take 1 tablet (1 mg total) by mouth at bedtime as needed for sleep. May increase by 49m (1 tab) PO q hs weekly if needed to achieve sleep (titrating dose) (Patient not taking: Reported on 10/13/2019)   [DISCONTINUED] omeprazole (PRILOSEC) 20 MG capsule TAKE 1 CAPSULE BY MOUTH EVERY DAY (Patient not taking: Reported on 01/26/2021)   [DISCONTINUED] predniSONE (DELTASONE) 20 MG tablet Take 1 tablet (20 mg total) by mouth daily with breakfast. (Patient not taking: Reported on 11/29/2021)   No facility-administered medications prior to visit.    Review of Systems  Constitutional:  Positive for fatigue.  Gastrointestinal:  Positive for abdominal distention.  Musculoskeletal:  Positive for arthralgias, neck pain and neck stiffness.  Neurological:  Positive for headaches.  All other systems reviewed and are negative.   Last thyroid functions Lab Results  Component Value Date   TSH 0.187 (L) 11/29/2021      Objective     BP 127/84 (BP Location: Right Arm, Patient Position: Sitting, Cuff Size: Normal)   Pulse 92   Resp 16   Ht 5' 3"  (1.6 m)   Wt 147 lb (66.7 kg)   SpO2 97%   BMI 26.04 kg/m  BP Readings from Last 3 Encounters:  11/29/21 127/84  01/26/21 112/71  10/13/19 130/80   Wt Readings from Last 3 Encounters:  11/29/21 147 lb (66.7 kg)  01/26/21 151 lb (68.5 kg)  10/13/19 172 lb 3.2 oz (78.1 kg)       Physical Exam Vitals reviewed.  Constitutional:      Appearance: She is well-developed.  HENT:     Head: Normocephalic and atraumatic.     Right Ear: External ear normal.     Left Ear: External ear normal.     Nose: Nose normal.  Eyes:     General: No scleral icterus.    Conjunctiva/sclera: Conjunctivae normal.  Neck:     Thyroid: No thyromegaly.  Cardiovascular:     Rate and Rhythm: Normal rate and regular rhythm.     Heart sounds: Normal heart sounds.  Pulmonary:     Effort: Pulmonary effort is normal.     Breath sounds: Normal breath sounds.  Abdominal:      Palpations: Abdomen is soft.  Musculoskeletal:     Right lower leg: No edema.     Left lower leg: No edema.  Skin:    General: Skin is warm and dry.  Neurological:     General: No focal deficit present.     Mental Status: She is alert and oriented to person, place, and time.  Psychiatric:        Mood and Affect: Mood normal.        Behavior: Behavior normal.  Thought Content: Thought content normal.        Judgment: Judgment normal.       Last depression screening scores    11/29/2021    9:34 AM 10/13/2019    9:20 AM 07/11/2017    2:08 PM  PHQ 2/9 Scores  PHQ - 2 Score 0 1 1  PHQ- 9 Score 1 5 3    Last fall risk screening    11/29/2021    9:34 AM  [place] in the past year? 0  Number falls in past yr: 0  Injury with Fall? 0  Risk for fall due to : No Fall Risks  Follow up Falls evaluation completed   Last Audit-C alcohol use screening    11/29/2021    9:34 AM  Alcohol Use Disorder Test (AUDIT)  1. How often do you have a drink containing alcohol? 0  2. How many drinks containing alcohol do you have on a typical day when you are drinking? 0  3. How often do you have six or more drinks on one occasion? 0  AUDIT-C Score 0   A score of 3 or more in women, and 4 or more in men indicates increased risk for alcohol abuse, EXCEPT if all of the points are from question 1   Results for orders placed or performed in visit on 11/29/21  CBC with Differential/Platelet  Result Value Ref Range   WBC 8.7 3.4 - 10.8 x10E3/uL   RBC 5.03 3.77 - 5.28 x10E6/uL   Hemoglobin 12.3 11.1 - 15.9 g/dL   Hematocrit 39.4 34.0 - 46.6 %   MCV 78 (L) 79 - 97 fL   MCH 24.5 (L) 26.6 - 33.0 pg   MCHC 31.2 (L) 31.5 - 35.7 g/dL   RDW 14.1 11.7 - 15.4 %   Platelets 368 150 - 450 x10E3/uL   Neutrophils 70 Not Estab. %   Lymphs 21 Not Estab. %   Monocytes 7 Not Estab. %   Eos 1 Not Estab. %   Basos 1 Not Estab. %   Neutrophils Absolute 6.2 1.4 - 7.0 x10E3/uL   Lymphocytes Absolute  1.8 0.7 - 3.1 x10E3/uL   Monocytes Absolute 0.6 0.1 - 0.9 x10E3/uL   EOS (ABSOLUTE) 0.0 0.0 - 0.4 x10E3/uL   Basophils Absolute 0.1 0.0 - 0.2 x10E3/uL   Immature Granulocytes 0 Not Estab. %   Immature Grans (Abs) 0.0 0.0 - 0.1 x10E3/uL  Comprehensive metabolic panel  Result Value Ref Range   Glucose 100 (H) 70 - 99 mg/dL   BUN 12 6 - 24 mg/dL   Creatinine, Ser 0.62 0.57 - 1.00 mg/dL   eGFR 105 >59 mL/min/1.73   BUN/Creatinine Ratio 19 9 - 23   Sodium 139 134 - 144 mmol/L   Potassium 4.1 3.5 - 5.2 mmol/L   Chloride 100 96 - 106 mmol/L   CO2 21 20 - 29 mmol/L   Calcium 10.0 8.7 - 10.2 mg/dL   Total Protein 7.4 6.0 - 8.5 g/dL   Albumin 4.9 3.8 - 4.9 g/dL   Globulin, Total 2.5 1.5 - 4.5 g/dL   Albumin/Globulin Ratio 2.0 1.2 - 2.2   Bilirubin Total <0.2 0.0 - 1.2 mg/dL   Alkaline Phosphatase 115 44 - 121 IU/L   AST 72 (H) 0 - 40 IU/L   ALT 63 (H) 0 - 32 IU/L  Lipid panel  Result Value Ref Range   Cholesterol, Total 218 (H) 100 - 199 mg/dL   Triglycerides 118 0 -  149 mg/dL   HDL 87 >39 mg/dL   VLDL Cholesterol Cal 20 5 - 40 mg/dL   LDL Chol Calc (NIH) 111 (H) 0 - 99 mg/dL   Chol/HDL Ratio 2.5 0.0 - 4.4 ratio  TSH  Result Value Ref Range   TSH 0.187 (L) 0.450 - 4.500 uIU/mL    Assessment & Plan    Routine Health Maintenance and Physical Exam  Exercise Activities and Dietary recommendations  Goals   None     Immunization History  Administered Date(s) Administered   Influenza Whole 04/18/2017   Influenza,inj,Quad PF,6+ Mos 03/12/2015, 03/05/2016, 04/05/2017, 03/03/2018   Tdap 10/07/2008    Health Maintenance  Topic Date Due   COVID-19 Vaccine (1) Never done   Hepatitis C Screening  Never done   Zoster Vaccines- Shingrix (1 of 2) Never done   TETANUS/TDAP  10/08/2018   MAMMOGRAM  05/31/2019   PAP SMEAR-Modifier  05/30/2020   INFLUENZA VACCINE  01/16/2022   COLONOSCOPY (Pts 45-44yr Insurance coverage will need to be confirmed)  09/17/2027   HIV Screening   Completed   HPV VACCINES  Aged Out    Discussed health benefits of physical activity, and encouraged her to engage in regular exercise appropriate for her age and condition.  1. Annual physical exam Up-to-date on screenings. The patient has had intentional weight loss. - CBC with Differential/Platelet - Comprehensive metabolic panel - Lipid panel - TSH  2. Hypertension, unspecified type  - CBC with Differential/Platelet - Comprehensive metabolic panel - Lipid panel - TSH  3. Adult hypothyroidism Decrease Synthroid from 137 to 125 mcg for suppressed TSH  - CBC with Differential/Platelet - Comprehensive metabolic panel - Lipid panel - TSH  4. Menopausal symptoms  - CBC with Differential/Platelet - Comprehensive metabolic panel - Lipid panel - TSH  5. Neck pain on right side  - DG Cervical Spine Complete; Future  6.  Neck pain I think this is an ergonomic issue as she works from home on the computer at a standing desk. X-ray neck and try Flexeril 10 mg nightly as needed  Return in about 3 months (around 03/01/2022).     I, [name], CMA, have reviewed all documentation for this visit. The documentation on 12/04/21 for the exam, diagnosis, procedures, and orders are all accurate and complete.    Neftali Thurow GCranford Mon MD  [phone](phone) [phone](fax)  [place]

## 2021-11-29 ENCOUNTER — Encounter: Payer: Self-pay | Admitting: Family Medicine

## 2021-11-29 ENCOUNTER — Ambulatory Visit (INDEPENDENT_AMBULATORY_CARE_PROVIDER_SITE_OTHER): Payer: BC Managed Care – PPO | Admitting: Family Medicine

## 2021-11-29 VITALS — BP 127/84 | HR 92 | Resp 16 | Ht 63.0 in | Wt 147.0 lb

## 2021-11-29 DIAGNOSIS — N951 Menopausal and female climacteric states: Secondary | ICD-10-CM

## 2021-11-29 DIAGNOSIS — I10 Essential (primary) hypertension: Secondary | ICD-10-CM

## 2021-11-29 DIAGNOSIS — Z1389 Encounter for screening for other disorder: Secondary | ICD-10-CM

## 2021-11-29 DIAGNOSIS — M542 Cervicalgia: Secondary | ICD-10-CM | POA: Diagnosis not present

## 2021-11-29 DIAGNOSIS — E039 Hypothyroidism, unspecified: Secondary | ICD-10-CM

## 2021-11-29 DIAGNOSIS — Z0000 Encounter for general adult medical examination without abnormal findings: Secondary | ICD-10-CM

## 2021-11-29 MED ORDER — CYCLOBENZAPRINE HCL 10 MG PO TABS: 10.0000 mg | ORAL_TABLET | Freq: Every day | ORAL | 1 refills | Status: AC

## 2021-11-29 MED ORDER — LEVOTHYROXINE SODIUM 125 MCG PO TABS: 125.0000 ug | ORAL_TABLET | Freq: Every day | ORAL | 3 refills | Status: DC

## 2021-11-29 NOTE — Patient Instructions (Signed)
Stop levothyroxine 137 dose and change to the new prescription that has been sent in. Work on Administrator, sports and computer to try to help the neck pain. Try to limit Tylenol to 2 or 3 Tylenol daily and to avoid Aleve or Advil.

## 2021-11-30 LAB — CBC WITH DIFFERENTIAL/PLATELET
Basophils Absolute: 0.1 10*3/uL (ref 0.0–0.2)
Basos: 1 %
EOS (ABSOLUTE): 0 10*3/uL (ref 0.0–0.4)
Eos: 1 %
Hematocrit: 39.4 % (ref 34.0–46.6)
Hemoglobin: 12.3 g/dL (ref 11.1–15.9)
Immature Grans (Abs): 0 10*3/uL (ref 0.0–0.1)
Immature Granulocytes: 0 %
Lymphocytes Absolute: 1.8 10*3/uL (ref 0.7–3.1)
Lymphs: 21 %
MCH: 24.5 pg — ABNORMAL LOW (ref 26.6–33.0)
MCHC: 31.2 g/dL — ABNORMAL LOW (ref 31.5–35.7)
MCV: 78 fL — ABNORMAL LOW (ref 79–97)
Monocytes Absolute: 0.6 10*3/uL (ref 0.1–0.9)
Monocytes: 7 %
Neutrophils Absolute: 6.2 10*3/uL (ref 1.4–7.0)
Neutrophils: 70 %
Platelets: 368 10*3/uL (ref 150–450)
RBC: 5.03 x10E6/uL (ref 3.77–5.28)
RDW: 14.1 % (ref 11.7–15.4)
WBC: 8.7 10*3/uL (ref 3.4–10.8)

## 2021-11-30 LAB — COMPREHENSIVE METABOLIC PANEL
ALT: 63 IU/L — ABNORMAL HIGH (ref 0–32)
AST: 72 IU/L — ABNORMAL HIGH (ref 0–40)
Albumin/Globulin Ratio: 2 (ref 1.2–2.2)
Albumin: 4.9 g/dL (ref 3.8–4.9)
Alkaline Phosphatase: 115 IU/L (ref 44–121)
BUN/Creatinine Ratio: 19 (ref 9–23)
BUN: 12 mg/dL (ref 6–24)
Bilirubin Total: <0.2 mg/dL (ref 0.0–1.2)
CO2: 21 mmol/L (ref 20–29)
Calcium: 10 mg/dL (ref 8.7–10.2)
Chloride: 100 mmol/L (ref 96–106)
Creatinine, Ser: 0.62 mg/dL (ref 0.57–1.00)
Globulin, Total: 2.5 g/dL (ref 1.5–4.5)
Glucose: 100 mg/dL — ABNORMAL HIGH (ref 70–99)
Potassium: 4.1 mmol/L (ref 3.5–5.2)
Sodium: 139 mmol/L (ref 134–144)
Total Protein: 7.4 g/dL (ref 6.0–8.5)
eGFR: 105 mL/min/{1.73_m2} (ref >59)

## 2021-11-30 LAB — LIPID PANEL
Chol/HDL Ratio: 2.5 ratio (ref 0.0–4.4)
Cholesterol, Total: 218 mg/dL — ABNORMAL HIGH (ref 100–199)
HDL: 87 mg/dL (ref >39)
LDL Chol Calc (NIH): 111 mg/dL — ABNORMAL HIGH (ref 0–99)
Triglycerides: 118 mg/dL (ref 0–149)
VLDL Cholesterol Cal: 20 mg/dL (ref 5–40)

## 2021-11-30 LAB — TSH: TSH: 0.187 u[IU]/mL — ABNORMAL LOW (ref 0.450–4.500)

## 2021-12-04 ENCOUNTER — Ambulatory Visit
Admission: RE | Admit: 2021-12-04 | Discharge: 2021-12-04 | Disposition: A | Discharge status: Home / Self Care | Payer: BC Managed Care – PPO | Source: Ambulatory Visit | Attending: Family Medicine | Admitting: Family Medicine

## 2021-12-04 ENCOUNTER — Ambulatory Visit
Admission: RE | Admit: 2021-12-04 | Discharge: 2021-12-04 | Disposition: A | Discharge status: Home / Self Care | Payer: BC Managed Care – PPO | Attending: Family Medicine | Admitting: Family Medicine

## 2021-12-04 DIAGNOSIS — M542 Cervicalgia: Secondary | ICD-10-CM

## 2021-12-08 ENCOUNTER — Other Ambulatory Visit: Payer: Self-pay | Admitting: *Deleted

## 2021-12-08 ENCOUNTER — Telehealth: Payer: Self-pay

## 2021-12-08 MED ORDER — LEVOTHYROXINE SODIUM 112 MCG PO TABS: 112.0000 ug | ORAL_TABLET | Freq: Every day | ORAL | 0 refills | Status: DC

## 2021-12-13 ENCOUNTER — Other Ambulatory Visit: Payer: Self-pay | Admitting: Family Medicine

## 2021-12-13 DIAGNOSIS — I10 Essential (primary) hypertension: Secondary | ICD-10-CM

## 2021-12-13 NOTE — Telephone Encounter (Signed)
Requested Prescriptions  Pending Prescriptions Disp Refills  . amLODipine (NORVASC) 5 MG tablet [Pharmacy Med Name: AMLODIPINE BESYLATE 5 MG TAB] 30 tablet 3    Sig: TAKE 1 TABLET (5 MG TOTAL) BY MOUTH DAILY.     Cardiovascular: Calcium Channel Blockers 2 Passed - 12/13/2021  1:31 PM      Passed - Last BP in normal range    BP Readings from Last 1 Encounters:  11/29/21 127/84         Passed - Last Heart Rate in normal range    Pulse Readings from Last 1 Encounters:  11/29/21 92         Passed - Valid encounter within last 6 months    Recent Outpatient Visits          2 weeks ago Annual physical exam   [facility] Maple Hudson., MD   10 months ago Annual physical exam   [facility] Maple Hudson., MD   2 years ago Anxiety   [facility] Maple Hudson., MD   2 years ago Dysuria   [facility] Joycelyn Man [place], [place]   3 years ago Encounter for annual physical exam   [facility] Maple Hudson., MD      Future Appointments            In 2 months Maple Hudson., MD [facility]   In 11 months Maple Hudson., MD [facility]

## 2022-01-21 ENCOUNTER — Other Ambulatory Visit: Payer: Self-pay | Admitting: Family Medicine

## 2022-01-21 DIAGNOSIS — E039 Hypothyroidism, unspecified: Secondary | ICD-10-CM

## 2022-01-29 ENCOUNTER — Other Ambulatory Visit: Payer: Self-pay | Admitting: Family Medicine

## 2022-01-29 DIAGNOSIS — G43809 Other migraine, not intractable, without status migrainosus: Secondary | ICD-10-CM

## 2022-01-29 DIAGNOSIS — N951 Menopausal and female climacteric states: Secondary | ICD-10-CM

## 2022-03-02 ENCOUNTER — Other Ambulatory Visit: Payer: Self-pay | Admitting: Family Medicine

## 2022-03-09 NOTE — Progress Notes (Unsigned)
Established patient visit  I,[name],acting as a scribe for Wilhemena Durie, MD.,have documented all relevant documentation on the behalf of Wilhemena Durie, MD,as directed by  Wilhemena Durie, MD while in the presence of Wilhemena Durie, MD.   Patient: Sheryl Tran   DOB: 11-12-1965   56 y.o. Female  MRN: [id number] Visit Date: 03/12/2022  Today's healthcare provider: Wilhemena Durie, MD   Chief Complaint  Patient presents with   Follow-up   Hypertension   Hypothyroidism   Subjective    HPI  Hypertension, follow-up  BP Readings from Last 3 Encounters:  03/12/22 120/85  11/29/21 127/84  01/26/21 112/71   Wt Readings from Last 3 Encounters:  03/12/22 155 lb (70.3 kg)  11/29/21 147 lb (66.7 kg)  01/26/21 151 lb (68.5 kg)     She was last seen for hypertension 3 months ago.  Management since that visit includes; on amlodipine 5 mg.  Outside blood pressures are not checking.  Pertinent labs Lab Results  Component Value Date   CHOL 218 (H) 11/29/2021   HDL 87 11/29/2021   LDLCALC 111 (H) 11/29/2021   TRIG 118 11/29/2021   CHOLHDL 2.5 11/29/2021   Lab Results  Component Value Date   NA 139 11/29/2021   K 4.1 11/29/2021   CREATININE 0.62 11/29/2021   EGFR 105 11/29/2021   GLUCOSE 100 (H) 11/29/2021   TSH 0.187 (L) 11/29/2021     The 10-year ASCVD risk score (Arnett DK, et al., 2019) is: 1.7%  --------------------------------------------------------------------------------------------------- Hypothyroid, follow-up  Lab Results  Component Value Date   TSH 0.187 (L) 11/29/2021   TSH 1.140 02/01/2021   TSH 3.650 10/15/2019    Wt Readings from Last 3 Encounters:  03/12/22 155 lb (70.3 kg)  11/29/21 147 lb (66.7 kg)  01/26/21 151 lb (68.5 kg)    She was last seen for hypothyroid 3 months ago.  Management since that visit includes; labs checked showing-stable.  Liver mildly inflamed, cut back or stop alcohol.  Thyroid slightly  overtreated and would decrease Synthroid from 125 to 112 mcg daily and follow-up in 3 months to repeat both of these labs.  -----------------------------------------------------------------------------------------   Medications: Outpatient Medications Prior to Visit  Medication Sig   amLODipine (NORVASC) 5 MG tablet TAKE 1 TABLET (5 MG TOTAL) BY MOUTH DAILY.   Calcium Carbonate-Vitamin D 600-200 MG-UNIT CAPS Take 1 tablet by mouth 2 (two) times daily.   cyclobenzaprine (FLEXERIL) 10 MG tablet Take 1 tablet (10 mg total) by mouth at bedtime.   furosemide (LASIX) 20 MG tablet TAKE 1 TABLET BY MOUTH EVERY DAY AS NEEDED   gabapentin (NEURONTIN) 100 MG capsule TAKE 1 CAPSULE BY MOUTH EVERYDAY AT BEDTIME   levothyroxine (SYNTHROID) 112 MCG tablet TAKE 1 TABLET BY MOUTH EVERY DAY   levothyroxine (SYNTHROID) 137 MCG tablet TAKE 1 TABLET BY MOUTH EVERY DAY   MULTIPLE VITAMINS-MINERALS PO Take 1 tablet by mouth daily.   naproxen (NAPROSYN) 500 MG tablet Take by mouth.   No facility-administered medications prior to visit.    Review of Systems  Constitutional:  Negative for appetite change, chills, fatigue and fever.  Respiratory:  Negative for chest tightness and shortness of breath.   Cardiovascular:  Negative for chest pain and palpitations.  Gastrointestinal:  Negative for abdominal pain, nausea and vomiting.  Neurological:  Negative for dizziness and weakness.    {Labs  Heme  Chem  Endocrine  Serology  Results Review (optional):23779}  Objective    BP 120/85 (BP Location: Right Arm, Patient Position: Sitting, Cuff Size: Normal)   Pulse 86   Resp 16   Wt 155 lb (70.3 kg)   SpO2 99%   BMI 27.46 kg/m  {Show previous vital signs (optional):23777}  Physical Exam  ***  No results found for any visits on 03/12/22.  Assessment & Plan     ***  No follow-ups on file.      {provider attestation***:1}   Wilhemena Durie, MD  [facility] [phone]  (phone) [phone] (fax)  [place]

## 2022-03-12 ENCOUNTER — Encounter: Payer: Self-pay | Admitting: Family Medicine

## 2022-03-12 ENCOUNTER — Ambulatory Visit: Payer: BC Managed Care – PPO | Admitting: Family Medicine

## 2022-03-12 VITALS — BP 120/85 | HR 86 | Resp 16 | Wt 155.0 lb

## 2022-03-12 DIAGNOSIS — F419 Anxiety disorder, unspecified: Secondary | ICD-10-CM

## 2022-03-12 DIAGNOSIS — I10 Essential (primary) hypertension: Secondary | ICD-10-CM

## 2022-03-12 DIAGNOSIS — R7989 Other specified abnormal findings of blood chemistry: Secondary | ICD-10-CM

## 2022-03-12 DIAGNOSIS — G43809 Other migraine, not intractable, without status migrainosus: Secondary | ICD-10-CM

## 2022-03-12 DIAGNOSIS — E039 Hypothyroidism, unspecified: Secondary | ICD-10-CM | POA: Diagnosis not present

## 2022-03-13 LAB — COMPREHENSIVE METABOLIC PANEL
ALT: 21 IU/L (ref 0–32)
AST: 24 IU/L (ref 0–40)
Albumin/Globulin Ratio: 2 (ref 1.2–2.2)
Albumin: 4.5 g/dL (ref 3.8–4.9)
Alkaline Phosphatase: 83 IU/L (ref 44–121)
BUN/Creatinine Ratio: 15 (ref 9–23)
BUN: 12 mg/dL (ref 6–24)
Bilirubin Total: <0.2 mg/dL (ref 0.0–1.2)
CO2: 22 mmol/L (ref 20–29)
Calcium: 9.2 mg/dL (ref 8.7–10.2)
Chloride: 102 mmol/L (ref 96–106)
Creatinine, Ser: 0.82 mg/dL (ref 0.57–1.00)
Globulin, Total: 2.2 g/dL (ref 1.5–4.5)
Glucose: 93 mg/dL (ref 70–99)
Potassium: 4.1 mmol/L (ref 3.5–5.2)
Sodium: 139 mmol/L (ref 134–144)
Total Protein: 6.7 g/dL (ref 6.0–8.5)
eGFR: 84 mL/min/{1.73_m2} (ref >59)

## 2022-03-13 LAB — CBC WITH DIFFERENTIAL/PLATELET
Basophils Absolute: 0.1 10*3/uL (ref 0.0–0.2)
Basos: 1 %
EOS (ABSOLUTE): 0 10*3/uL (ref 0.0–0.4)
Eos: 1 %
Hematocrit: 33.8 % — ABNORMAL LOW (ref 34.0–46.6)
Hemoglobin: 10.5 g/dL — ABNORMAL LOW (ref 11.1–15.9)
Immature Grans (Abs): 0 10*3/uL (ref 0.0–0.1)
Immature Granulocytes: 0 %
Lymphocytes Absolute: 1.5 10*3/uL (ref 0.7–3.1)
Lymphs: 23 %
MCH: 23.5 pg — ABNORMAL LOW (ref 26.6–33.0)
MCHC: 31.1 g/dL — ABNORMAL LOW (ref 31.5–35.7)
MCV: 76 fL — ABNORMAL LOW (ref 79–97)
Monocytes Absolute: 0.5 10*3/uL (ref 0.1–0.9)
Monocytes: 7 %
Neutrophils Absolute: 4.6 10*3/uL (ref 1.4–7.0)
Neutrophils: 68 %
Platelets: 339 10*3/uL (ref 150–450)
RBC: 4.47 x10E6/uL (ref 3.77–5.28)
RDW: 14.7 % (ref 11.7–15.4)
WBC: 6.7 10*3/uL (ref 3.4–10.8)

## 2022-03-13 LAB — TSH: TSH: 2.27 u[IU]/mL (ref 0.450–4.500)

## 2022-03-24 ENCOUNTER — Other Ambulatory Visit: Payer: Self-pay | Admitting: Family Medicine

## 2022-03-24 DIAGNOSIS — I10 Essential (primary) hypertension: Secondary | ICD-10-CM

## 2022-03-26 NOTE — Telephone Encounter (Signed)
Requested medications are due for refill today.  Yes  Requested medications are on the active medications list.  yes  Last refill. 12/13/2021 #30 3 rf  Future visit scheduled.   With Dr. Rosanna Randy  Notes to clinic.  Dr. Rosanna Randy pt. LMOM to cb for ov.    Requested Prescriptions  Pending Prescriptions Disp Refills   amLODipine (NORVASC) 5 MG tablet [Pharmacy Med Name: AMLODIPINE BESYLATE 5 MG TAB] 90 tablet 1    Sig: TAKE 1 TABLET (5 MG TOTAL) BY MOUTH DAILY.     Cardiovascular: Calcium Channel Blockers 2 Passed - 03/24/2022  1:15 AM      Passed - Last BP in normal range    BP Readings from Last 1 Encounters:  03/12/22 120/85         Passed - Last Heart Rate in normal range    Pulse Readings from Last 1 Encounters:  03/12/22 86         Passed - Valid encounter within last 6 months    Recent Outpatient Visits           2 weeks ago Adult hypothyroidism   [facility] Jerrol Banana., MD   3 months ago Annual physical exam   [facility] Jerrol Banana., MD   1 year ago Annual physical exam   [facility] Jerrol Banana., MD   2 years ago [place] Jerrol Banana., MD   2 years ago [place], [place], [place]       Future Appointments             In 8 months Jerrol Banana., MD [facility], [place]

## 2022-03-26 NOTE — Telephone Encounter (Signed)
Called pt - LMOMTCB for appointment. 

## 2022-05-03 ENCOUNTER — Ambulatory Visit: Payer: BC Managed Care – PPO | Admitting: Family Medicine

## 2022-05-03 ENCOUNTER — Encounter: Payer: Self-pay | Admitting: Family Medicine

## 2022-05-03 VITALS — BP 119/64 | HR 88 | Temp 98.3°F | Resp 16 | Wt 154.0 lb

## 2022-05-03 DIAGNOSIS — U071 COVID-19: Secondary | ICD-10-CM | POA: Diagnosis not present

## 2022-05-03 DIAGNOSIS — Z20822 Contact with and (suspected) exposure to covid-19: Secondary | ICD-10-CM

## 2022-05-03 LAB — POC COVID19 BINAXNOW: SARS Coronavirus 2 Ag: POSITIVE — AB

## 2022-05-03 MED ORDER — NIRMATRELVIR/RITONAVIR (PAXLOVID)TABLET: 3.0000 | ORAL_TABLET | Freq: Two times a day (BID) | ORAL | 0 refills | Status: AC

## 2022-05-03 NOTE — Progress Notes (Signed)
I,Sheryl Tran,acting as a scribe for Sheryl Kindle, FNP.,have documented all relevant documentation on the behalf of Sheryl Kindle, FNP,as directed by  Sheryl Kindle, FNP while in the presence of Sheryl Kindle, FNP.   Established patient visit   Patient: Sheryl Tran   DOB: 1966-04-30   56 y.o. Female  MRN: [id number] Visit Date: 05/03/2022  Today's healthcare provider: [name], FNP  Patient presents for new patient visit to establish care.  Introduced to Publishing rights manager role and practice setting.  All questions answered.  Discussed provider/patient relationship and expectations.  No chief complaint on file.  Subjective    HPI  Patient states she was exposed to covid 4 days ago. Patient states she began feeling did take a home covid test, which was negative. She has symptoms of sinus congestion, dry cough, ear congestion. Patient has sudafed and Nyquil with mild relief.  Medications: Outpatient Medications Prior to Visit  Medication Sig   amLODipine (NORVASC) 5 MG tablet TAKE 1 TABLET (5 MG TOTAL) BY MOUTH DAILY.   Calcium Carbonate-Vitamin D 600-200 MG-UNIT CAPS Take 1 tablet by mouth 2 (two) times daily.   cyclobenzaprine (FLEXERIL) 10 MG tablet Take 1 tablet (10 mg total) by mouth at bedtime.   furosemide (LASIX) 20 MG tablet TAKE 1 TABLET BY MOUTH EVERY DAY AS NEEDED   gabapentin (NEURONTIN) 100 MG capsule TAKE 1 CAPSULE BY MOUTH EVERYDAY AT BEDTIME   levothyroxine (SYNTHROID) 112 MCG tablet TAKE 1 TABLET BY MOUTH EVERY DAY   levothyroxine (SYNTHROID) 137 MCG tablet TAKE 1 TABLET BY MOUTH EVERY DAY   MULTIPLE VITAMINS-MINERALS PO Take 1 tablet by mouth daily.   naproxen (NAPROSYN) 500 MG tablet Take by mouth.   No facility-administered medications prior to visit.    Review of Systems  Constitutional:  Negative for appetite change, chills, fatigue and fever.  Respiratory:  Negative for chest tightness and shortness of breath.   Cardiovascular:  Negative for  chest pain and palpitations.  Gastrointestinal:  Negative for abdominal pain, nausea and vomiting.  Neurological:  Negative for dizziness and weakness.     Objective    BP 119/64 (BP Location: Right Arm, Patient Position: Sitting, Cuff Size: Large)   Pulse 88   Temp 98.3 F (36.8 C) (Oral)   Resp 16   Wt 154 lb (69.9 kg)   SpO2 98%   BMI 27.28 kg/m   Physical Exam Vitals and nursing note reviewed.  Constitutional:      General: She is not in acute distress.    Appearance: Normal appearance. She is overweight. She is not ill-appearing, toxic-appearing or diaphoretic.  HENT:     Head: Normocephalic and atraumatic.     Right Ear: Tympanic membrane, ear canal and external ear normal.     Left Ear: Tympanic membrane, ear canal and external ear normal.     Nose: Congestion present.     Right Sinus: Maxillary sinus tenderness and frontal sinus tenderness present.     Left Sinus: Maxillary sinus tenderness and frontal sinus tenderness present.     Mouth/Throat:     Mouth: Mucous membranes are moist.     Pharynx: Oropharynx is clear.  Eyes:     Extraocular Movements: Extraocular movements intact.     Conjunctiva/sclera: Conjunctivae normal.     Pupils: Pupils are equal, round, and reactive to light.  Cardiovascular:     Rate and Rhythm: Normal rate and regular rhythm.     Pulses:  Normal pulses.     Heart sounds: Normal heart sounds. No murmur heard.    No friction rub. No gallop.  Pulmonary:     Effort: Pulmonary effort is normal. No respiratory distress.     Breath sounds: Normal breath sounds. No stridor. No wheezing, rhonchi or rales.  Chest:     Chest wall: No tenderness.  Musculoskeletal:        General: No swelling, tenderness, deformity or signs of injury. Normal range of motion.     Right lower leg: No edema.     Left lower leg: No edema.  Skin:    General: Skin is warm and dry.     Capillary Refill: Capillary refill takes less than 2 seconds.     Coloration: Skin  is not jaundiced or pale.     Findings: No bruising, erythema, lesion or rash.  Neurological:     General: No focal deficit present.     Mental Status: She is alert and oriented to person, place, and time. Mental status is at baseline.     Cranial Nerves: No cranial nerve deficit.     Sensory: No sensory deficit.     Motor: No weakness.     Coordination: Coordination normal.  Psychiatric:        Mood and Affect: Mood normal.        Behavior: Behavior normal.        Thought Content: Thought content normal.        Judgment: Judgment normal.    Results for orders placed or performed in visit on 05/03/22  POC COVID-19  Result Value Ref Range   SARS Coronavirus 2 Ag Positive (A) Negative    Assessment & Plan     Problem List Items Addressed This Visit       Other   Contact with and (suspected) exposure to covid-19 - Primary    Reports exposure 4 days ago      Relevant Orders   POC COVID-19 (Completed)   COVID-19    In office test positive, wishes to receive antivirals to assist Described as burning eyes, itching/sore throat, congestion, ear blocking up, dry cough and subjective fevers +known exposure       Relevant Medications   nirmatrelvir/ritonavir EUA (PAXLOVID) 20 x 150 MG & 10 x 100MG  TABS   Return if symptoms worsen or fail to improve.      , FNP, have reviewed all documentation for this visit. The documentation on 05/03/22 for the exam, diagnosis, procedures, and orders are all accurate and complete.  05/05/22, FNP  [facility] [phone] (phone) [phone] (fax)  [facility]

## 2022-05-03 NOTE — Assessment & Plan Note (Signed)
Reports exposure 4 days ago

## 2022-05-03 NOTE — Assessment & Plan Note (Signed)
In office test positive, wishes to receive antivirals to assist Described as burning eyes, itching/sore throat, congestion, ear blocking up, dry cough and subjective fevers +known exposure

## 2022-06-06 ENCOUNTER — Other Ambulatory Visit: Payer: Self-pay | Admitting: Family Medicine

## 2022-06-06 MED ORDER — LEVOTHYROXINE SODIUM 112 MCG PO TABS: 112.0000 ug | ORAL_TABLET | Freq: Every day | ORAL | 0 refills | Status: DC

## 2022-06-06 NOTE — Telephone Encounter (Signed)
Medication Refill - Medication: levothyroxine (SYNTHROID) 112 MCG tablet   Has the patient contacted their pharmacy? Yes.   (Agent: If no, request that the patient contact the pharmacy for the refill. If patient does not wish to contact the pharmacy document the reason why and proceed with request.) (Agent: If yes, when and what did the pharmacy advise?)  Preferred Pharmacy (with phone number or street name):  [id number] - [place], [place] [address] Phone: [phone]  Fax: [phone]     Has the patient been seen for an appointment in the last year OR does the patient have an upcoming appointment? Yes.    Agent: Please be advised that RX refills may take up to 3 business days. We ask that you follow-up with your pharmacy.

## 2022-06-06 NOTE — Telephone Encounter (Signed)
Requested Prescriptions  Pending Prescriptions Disp Refills   levothyroxine (SYNTHROID) 112 MCG tablet 90 tablet 0    Sig: Take 1 tablet (112 mcg total) by mouth daily.     Endocrinology:  Hypothyroid Agents Passed - 06/06/2022  9:23 AM      Passed - TSH in normal range and within 360 days    TSH  Date Value Ref Range Status  03/12/2022 2.270 0.450 - 4.500 uIU/mL Final         Passed - Valid encounter within last 12 months    Recent Outpatient Visits           1 month ago Contact with and (suspected) exposure to covid-19   [facility] [name], FNP   2 months ago Adult hypothyroidism   [facility] Maple Hudson., MD   6 months ago Annual physical exam   [facility] Maple Hudson., MD   1 year ago Annual physical exam   [facility] Maple Hudson., MD   2 years ago Anxiety   [facility] Maple Hudson., MD       Future Appointments             In 6 months Maple Hudson., MD [facility]

## 2022-06-28 ENCOUNTER — Other Ambulatory Visit: Payer: Self-pay | Admitting: Family Medicine

## 2022-06-28 DIAGNOSIS — N951 Menopausal and female climacteric states: Secondary | ICD-10-CM

## 2022-06-28 DIAGNOSIS — G43809 Other migraine, not intractable, without status migrainosus: Secondary | ICD-10-CM

## 2022-06-28 MED ORDER — GABAPENTIN 100 MG PO CAPS: ORAL_CAPSULE | ORAL | 0 refills | Status: DC

## 2022-06-28 NOTE — Telephone Encounter (Signed)
Medication Refill - Medication: gabapentin (NEURONTIN) 100 MG capsule [[id number]]   Has the patient contacted their pharmacy? Yes.   (Agent: If no, request that the patient contact the pharmacy for the refill. If patient does not wish to contact the pharmacy document the reason why and proceed with request.) (Agent: If yes, when and what did the pharmacy advise?)  Preferred Pharmacy (with phone number or street name): [id number] [place], [place] - [address], [place] [address] Phone: [phone]  Fax: [phone]  Has the patient been seen for an appointment in the last year OR does the patient have an upcoming appointment? Yes.    Agent: Please be advised that RX refills may take up to 3 business days. We ask that you follow-up with your pharmacy.

## 2022-06-28 NOTE — Telephone Encounter (Signed)
Requested Prescriptions  Pending Prescriptions Disp Refills   gabapentin (NEURONTIN) 100 MG capsule 90 capsule 0    Sig: TAKE 1 CAPSULE BY MOUTH EVERYDAY AT BEDTIME     Neurology: Anticonvulsants - gabapentin Passed - 06/28/2022  3:32 PM      Passed - Cr in normal range and within 360 days    Creatinine, Ser  Date Value Ref Range Status  03/12/2022 0.82 0.57 - 1.00 mg/dL Final         Passed - Completed PHQ-2 or PHQ-9 in the last 360 days      Passed - Valid encounter within last 12 months    Recent Outpatient Visits           1 month ago Contact with and (suspected) exposure to [place], Elise T, FNP   3 months ago Adult hypothyroidism   [facility] Jerrol Banana., MD   7 months ago Annual physical exam   [facility] Jerrol Banana., MD   1 year ago Annual physical exam   [facility] Jerrol Banana., MD   2 years ago [place] Jerrol Banana., MD       Future Appointments             In 5 months Jerrol Banana., MD [facility], [place]

## 2022-07-18 ENCOUNTER — Telehealth: Payer: Self-pay

## 2022-07-18 NOTE — Telephone Encounter (Signed)
Patient already scheduled with Dr. Alba Cory 11/2022

## 2022-07-18 NOTE — Telephone Encounter (Signed)
Copied from [place] [phone]. Topic: General - Inquiry >> Jul 18, 2022 10:20 AM Marcellus Scott wrote: Reason for CRM: Pt is requesting her care be transferred to Dr. Eulis Foster, per pt request scheduled physical with Dr.Simmons-Robinson.  Please advise.

## 2022-07-18 NOTE — Telephone Encounter (Signed)
Patient is ok to change providers.   Eulis Foster, MD  [facility]

## 2022-09-03 ENCOUNTER — Other Ambulatory Visit: Payer: Self-pay | Admitting: Family Medicine

## 2022-09-08 ENCOUNTER — Other Ambulatory Visit: Payer: Self-pay | Admitting: Family Medicine

## 2022-09-08 DIAGNOSIS — N951 Menopausal and female climacteric states: Secondary | ICD-10-CM

## 2022-09-08 DIAGNOSIS — G43809 Other migraine, not intractable, without status migrainosus: Secondary | ICD-10-CM

## 2022-09-08 DIAGNOSIS — I10 Essential (primary) hypertension: Secondary | ICD-10-CM

## 2022-09-10 NOTE — Telephone Encounter (Signed)
Requested Prescriptions  Pending Prescriptions Disp Refills   gabapentin (NEURONTIN) 100 MG capsule [Pharmacy Med Name: GABAPENTIN 100 MG CAPSULE] 90 capsule 1    Sig: TAKE 1 CAPSULE BY MOUTH EVERYDAY AT BEDTIME     Neurology: Anticonvulsants - gabapentin Passed - 09/08/2022  4:46 PM      Passed - Cr in normal range and within 360 days    Creatinine, Ser  Date Value Ref Range Status  03/12/2022 0.82 0.57 - 1.00 mg/dL Final         Passed - Completed PHQ-2 or PHQ-9 in the last 360 days      Passed - Valid encounter within last 12 months    Recent Outpatient Visits           4 months ago Contact with and (suspected) exposure to [place] Gwyneth Sprout, FNP   6 months ago Adult hypothyroidism   [place] Eulas Post, MD   9 months ago Annual physical exam   [facility] Eulas Post, MD   1 year ago Annual physical exam   [facility] Eulas Post, MD   2 years ago [place] Eulas Post, MD       Future Appointments             In 2 months Simmons-Robinson, Riki Sheer, MD [facility], [place]

## 2022-09-10 NOTE — Telephone Encounter (Signed)
Requested Prescriptions  Pending Prescriptions Disp Refills   amLODipine (NORVASC) 5 MG tablet [Pharmacy Med Name: AMLODIPINE BESYLATE 5 MG TAB] 90 tablet 1    Sig: TAKE 1 TABLET (5 MG TOTAL) BY MOUTH DAILY.     Cardiovascular: Calcium Channel Blockers 2 Passed - 09/08/2022  4:46 PM      Passed - Last BP in normal range    BP Readings from Last 1 Encounters:  05/03/22 119/64         Passed - Last Heart Rate in normal range    Pulse Readings from Last 1 Encounters:  05/03/22 88         Passed - Valid encounter within last 6 months    Recent Outpatient Visits           4 months ago Contact with and (suspected) exposure to [place] Gwyneth Sprout, FNP   6 months ago Adult hypothyroidism   [place] Eulas Post, MD   9 months ago Annual physical exam   [facility] Eulas Post, MD   1 year ago Annual physical exam   [facility] Eulas Post, MD   2 years ago [place] Eulas Post, MD       Future Appointments             In 2 months Simmons-Robinson, Riki Sheer, MD [facility], [place]

## 2022-11-20 ENCOUNTER — Other Ambulatory Visit: Payer: Self-pay | Admitting: Family Medicine

## 2022-11-20 NOTE — Telephone Encounter (Signed)
Requested Prescriptions  Pending Prescriptions Disp Refills   levothyroxine (SYNTHROID) 112 MCG tablet [Pharmacy Med Name: LEVOTHYROXINE 112 MCG TABLET] 90 tablet 0    Sig: TAKE 1 TABLET BY MOUTH EVERY DAY     Endocrinology:  Hypothyroid Agents Passed - 11/20/2022  6:37 AM      Passed - TSH in normal range and within 360 days    TSH  Date Value Ref Range Status  03/12/2022 2.270 0.450 - 4.500 uIU/mL Final         Passed - Valid encounter within last 12 months    Recent Outpatient Visits           6 months ago Contact with and (suspected) exposure to covid-19   [facility] [name], FNP   8 months ago Adult hypothyroidism   [facility] Bosie Clos, MD   11 months ago Annual physical exam   [facility] Bosie Clos, MD   1 year ago Annual physical exam   [facility] Bosie Clos, MD   3 years ago Anxiety    [facility] Bosie Clos, MD       Future Appointments             In 1 week Simmons-Robinson, Tawanna Cooler, MD [facility]

## 2022-12-03 ENCOUNTER — Encounter: Payer: BC Managed Care – PPO | Admitting: Family Medicine

## 2023-02-28 ENCOUNTER — Other Ambulatory Visit: Payer: Self-pay | Admitting: Family Medicine

## 2023-03-23 ENCOUNTER — Other Ambulatory Visit: Payer: Self-pay | Admitting: Family Medicine

## 2023-03-25 NOTE — Telephone Encounter (Signed)
Courtesy RF granted with last RF- needs appointment/labs Requested Prescriptions  Pending Prescriptions Disp Refills   levothyroxine (SYNTHROID) 112 MCG tablet [Pharmacy Med Name: LEVOTHYROXINE 112 MCG TABLET] 90 tablet 1    Sig: TAKE 1 TABLET BY MOUTH EVERY DAY     Endocrinology:  Hypothyroid Agents Failed - 03/23/2023 11:31 AM      Failed - TSH in normal range and within 360 days    TSH  Date Value Ref Range Status  03/12/2022 2.270 0.450 - 4.500 uIU/mL Final         Passed - Valid encounter within last 12 months    Recent Outpatient Visits           10 months ago Contact with and (suspected) exposure to covid-19   [facility] [name], FNP   1 year ago Adult hypothyroidism   [facility] Bosie Clos, MD   1 year ago Annual physical exam   [facility] Bosie Clos, MD   2 years ago Annual physical exam   [facility] Bosie Clos, MD   3 years ago Anxiety   [facility] Bosie Clos, MD

## 2024-04-17 IMAGING — CR DG CERVICAL SPINE COMPLETE 4+V
1 series · 6 of 6 positions shown · non-contrast
Comparison: None Available.

CLINICAL DATA: Right neck pain for 1 year.

EXAM:
CERVICAL SPINE - COMPLETE 4+ VIEW

[Series 1: dg cervical spine complete · 0.14mm/px · 6 of 6 slices shown]
[im 1/6]
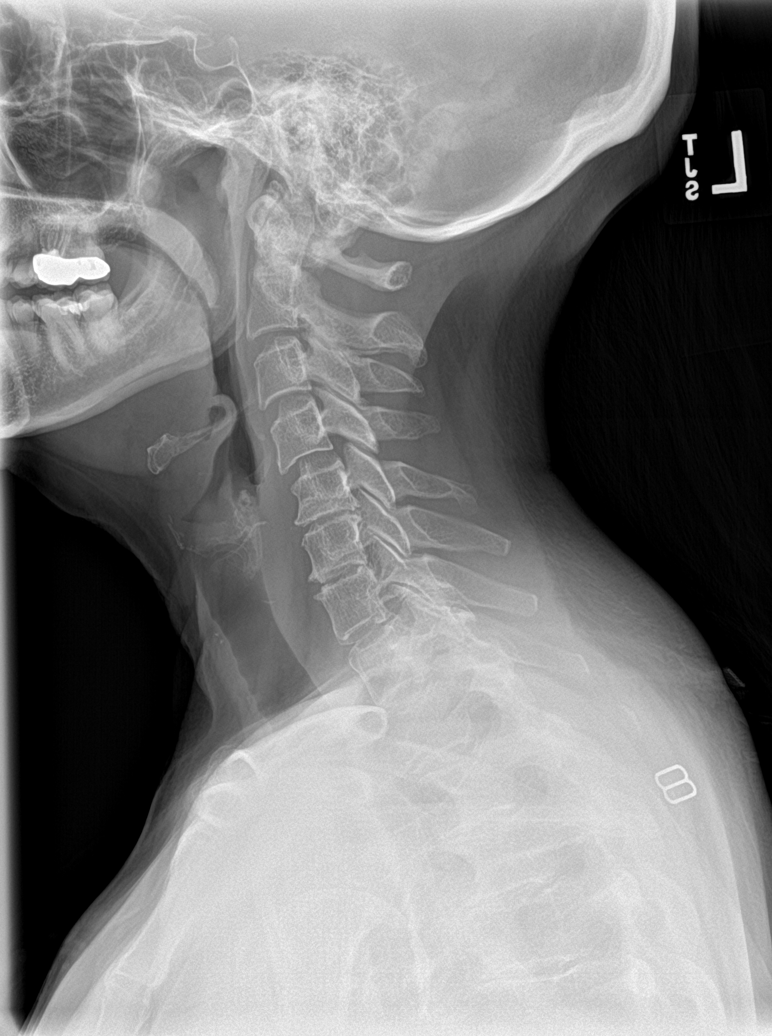
[im 2/6]
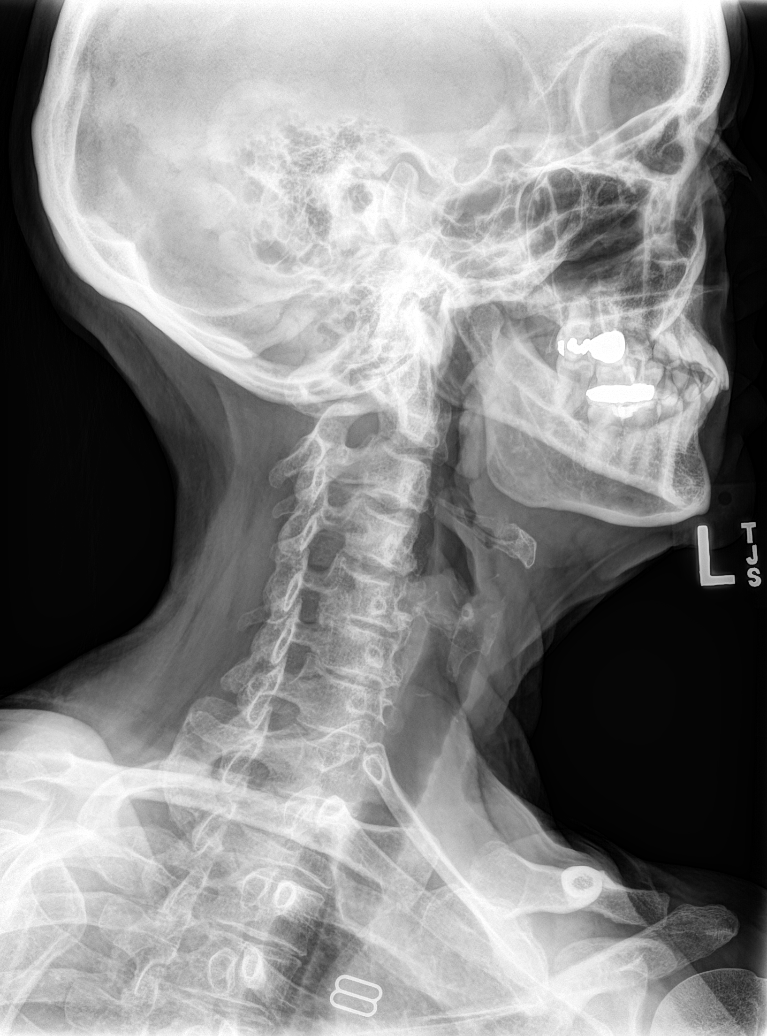
[im 3/6]
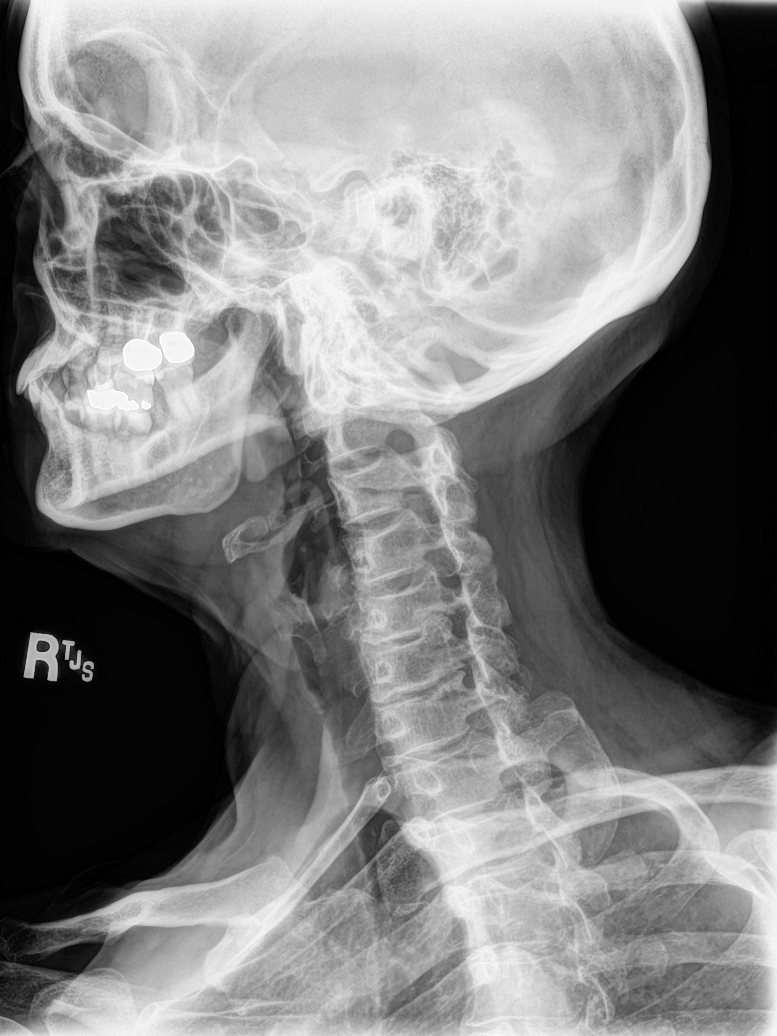
[im 4/6]
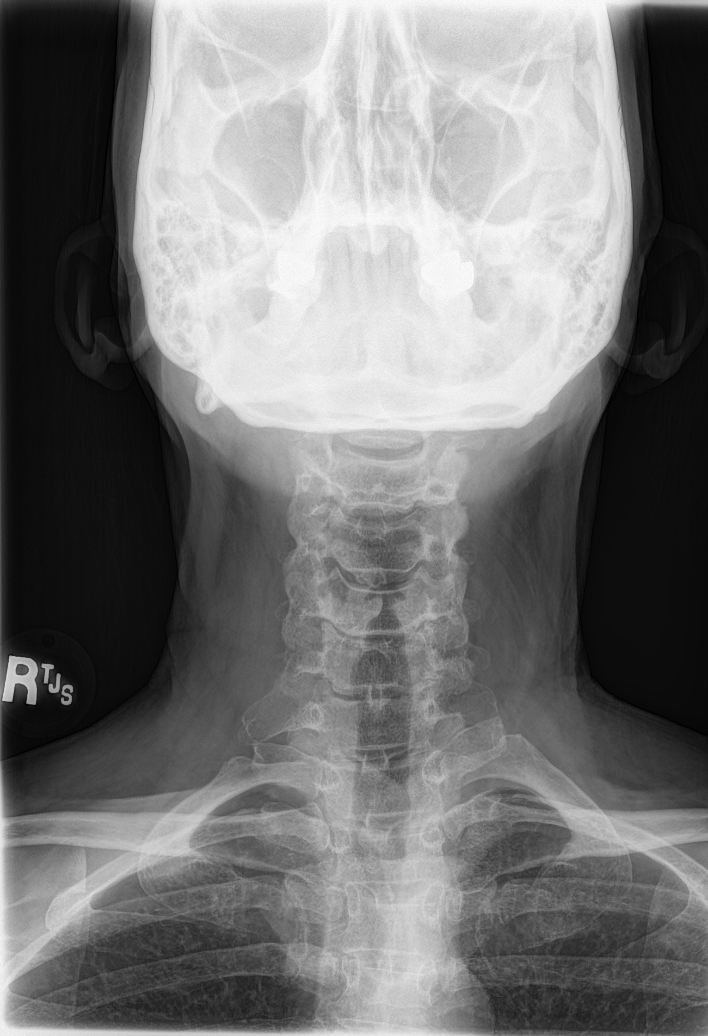
[im 5/6]
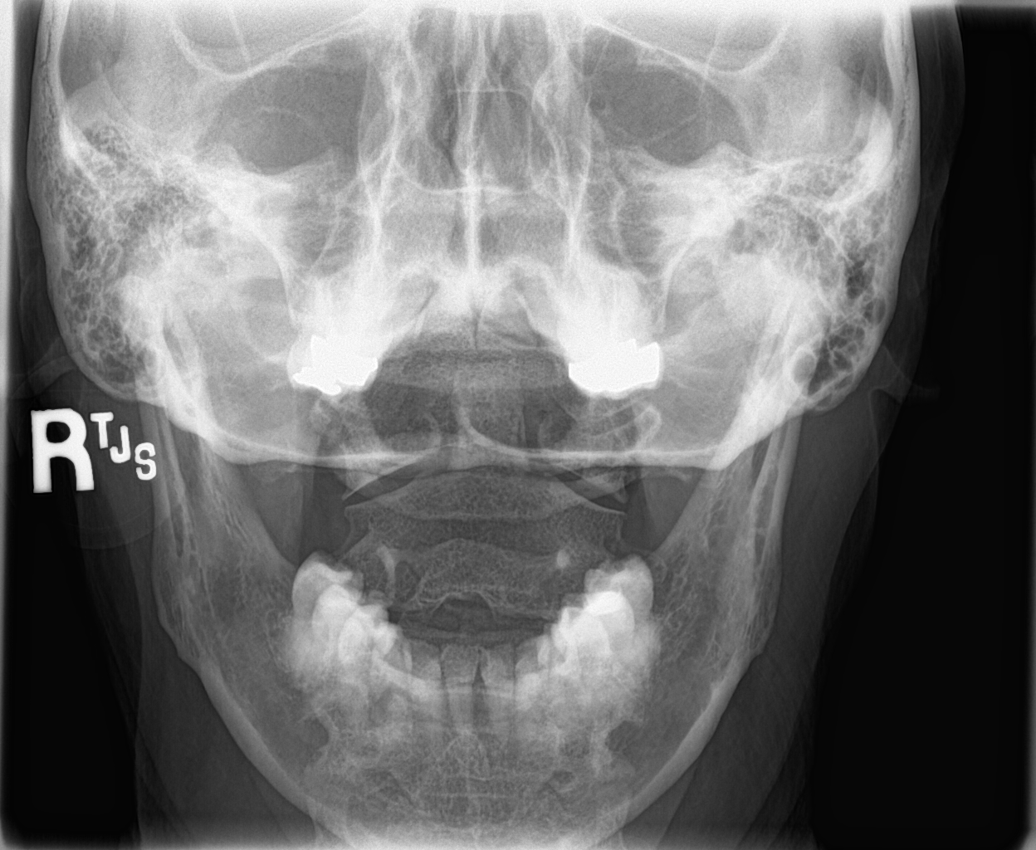
[im 6/6]
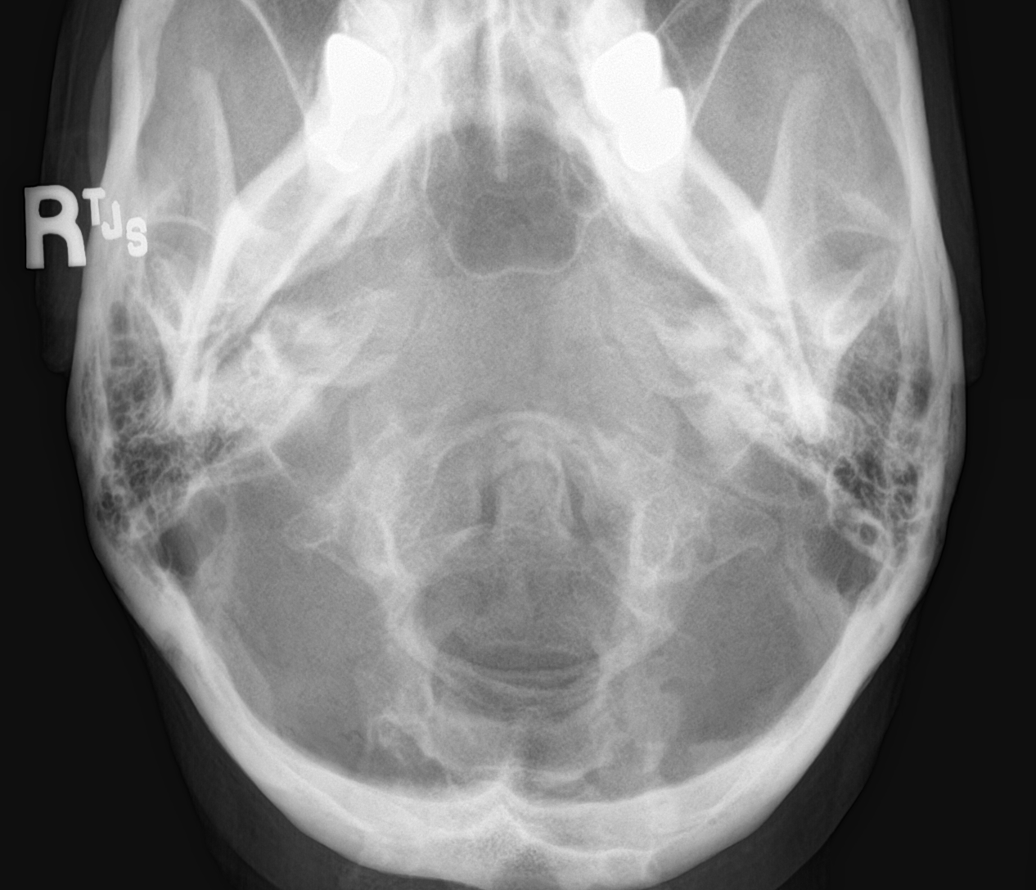

[6 of 6 positions shown; findings below may reference images not displayed]

FINDINGS: There is no evidence of cervical spine fracture or prevertebral soft
tissue swelling. Alignment is normal. Moderate degenerative joint
changes at C5 through C7 with narrowed joint space and osteophyte
formation are identified. There are mild narrowing of the left-sided
C5-6, C6-7 neural foramina due to osteophyte encroachment.
IMPRESSION: Degenerative joint changes of cervical spine.
